# Patient Record
Sex: Male | Born: 1955 | Race: White | Hispanic: No | Marital: Married | Smoking: Current every day smoker
Health system: Southern US, Community
[De-identification: ages and names within clinical notes are randomized; demographics above are authoritative.]

## PROBLEM LIST (undated history)

## (undated) DIAGNOSIS — T7840XA Allergy, unspecified, initial encounter: Secondary | ICD-10-CM

## (undated) DIAGNOSIS — E785 Hyperlipidemia, unspecified: Secondary | ICD-10-CM

## (undated) DIAGNOSIS — F419 Anxiety disorder, unspecified: Secondary | ICD-10-CM

## (undated) DIAGNOSIS — I10 Essential (primary) hypertension: Secondary | ICD-10-CM

## (undated) HISTORY — DX: Allergy, unspecified, initial encounter: T78.40XA

## (undated) HISTORY — DX: Anxiety disorder, unspecified: F41.9

## (undated) HISTORY — PX: TONSILLECTOMY: SUR1361

## (undated) HISTORY — DX: Essential (primary) hypertension: I10

## (undated) HISTORY — DX: Hyperlipidemia, unspecified: E78.5

---

## 2021-05-23 ENCOUNTER — Encounter: Payer: Self-pay | Admitting: Medical

## 2021-05-23 ENCOUNTER — Other Ambulatory Visit (HOSPITAL_BASED_OUTPATIENT_CLINIC_OR_DEPARTMENT_OTHER): Payer: Self-pay

## 2021-05-23 ENCOUNTER — Ambulatory Visit (INDEPENDENT_AMBULATORY_CARE_PROVIDER_SITE_OTHER): Payer: 59 | Admitting: Medical

## 2021-05-23 VITALS — BP 130/72 | HR 74 | Temp 97.9°F | Resp 16 | Ht 69.0 in | Wt 154.4 lb

## 2021-05-23 DIAGNOSIS — Z122 Encounter for screening for malignant neoplasm of respiratory organs: Secondary | ICD-10-CM

## 2021-05-23 DIAGNOSIS — F419 Anxiety disorder, unspecified: Secondary | ICD-10-CM | POA: Diagnosis not present

## 2021-05-23 DIAGNOSIS — I10 Essential (primary) hypertension: Secondary | ICD-10-CM

## 2021-05-23 DIAGNOSIS — R7989 Other specified abnormal findings of blood chemistry: Secondary | ICD-10-CM

## 2021-05-23 DIAGNOSIS — Z125 Encounter for screening for malignant neoplasm of prostate: Secondary | ICD-10-CM | POA: Diagnosis not present

## 2021-05-23 DIAGNOSIS — J449 Chronic obstructive pulmonary disease, unspecified: Secondary | ICD-10-CM

## 2021-05-23 DIAGNOSIS — F17200 Nicotine dependence, unspecified, uncomplicated: Secondary | ICD-10-CM | POA: Diagnosis not present

## 2021-05-23 DIAGNOSIS — Z1283 Encounter for screening for malignant neoplasm of skin: Secondary | ICD-10-CM

## 2021-05-23 DIAGNOSIS — I252 Old myocardial infarction: Secondary | ICD-10-CM

## 2021-05-23 DIAGNOSIS — L989 Disorder of the skin and subcutaneous tissue, unspecified: Secondary | ICD-10-CM

## 2021-05-23 MED ORDER — BUSPIRONE HCL 10 MG PO TABS
10.0000 mg | ORAL_TABLET | Freq: Two times a day (BID) | ORAL | 0 refills | Status: DC
  Filled 2021-05-23: qty 60, 30d supply, fill #0

## 2021-05-23 MED ORDER — VENLAFAXINE HCL ER 37.5 MG PO CP24
37.5000 mg | ORAL_CAPSULE | Freq: Every day | ORAL | 0 refills | Status: DC
  Filled 2021-05-23: qty 30, 30d supply, fill #0

## 2021-05-23 MED ORDER — ATORVASTATIN CALCIUM 10 MG PO TABS
10.0000 mg | ORAL_TABLET | Freq: Every day | ORAL | 3 refills | Status: DC
  Filled 2021-05-23: qty 30, 30d supply, fill #0
  Filled 2021-06-20: qty 30, 30d supply, fill #1
  Filled 2021-07-24: qty 30, 30d supply, fill #2
  Filled 2021-08-20: qty 30, 30d supply, fill #3

## 2021-05-23 NOTE — Progress Notes (Signed)
Subjective:    Patient ID: Eric Gillespie, male    DOB: 25-Dec-1955, 66 y.o.   MRN: [id number]  HPI Pt in for first time.   Pt moved from [place]. Move to area 9 months ago.  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 No regular exercise apart from working on his house. Pt states avoiding salt and eating healthy. Smoker- pack a day since 66 yo. Approximate 46 yr pack a day. 5 shots of vodka a week.  Some mild ldl and triglyceride elevation.  Hypertension-on Tenormin 50 mg daily.  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 Recently former pcp told him he did not need to be on cholesterol meds any more?  Anxiety- pt is on buspar . He states on 10 mg twice a day and he feels like not working. No other meds for anxiety tried.  Phq-9 score was 14. He states feels more anxious.  Low testosterone in past. Most recent total T 132. Free 8.1. lab available done in [place].  Review of Systems  Constitutional:  Negative for chills, diaphoresis and fatigue.  HENT:  Negative for congestion.   Respiratory:  Negative for cough, chest tightness, shortness of breath and wheezing.   Cardiovascular:  Negative for chest pain and palpitations.  Gastrointestinal:  Negative for abdominal pain, blood in stool and nausea.  Musculoskeletal:  Negative for back pain, joint swelling, myalgias and neck stiffness.  Skin:  Negative for rash.  Psychiatric/Behavioral:  Positive for dysphoric mood. Negative for self-injury and suicidal ideas. The patient is nervous/anxious.     Past Medical History:  Diagnosis Date   Allergy    Anxiety    Hyperlipidemia    Hypertension      Social History   Socioeconomic History   Marital status: Married    Spouse name: Not on file   Number of children: Not on file   Years of education: Not on file   Highest education level: Not on file  Occupational History   Not on file  Tobacco Use   Smoking status: Every Day    Packs/day: 1.00    Types: Cigarettes   Smokeless tobacco: Never  Vaping Use    Vaping Use: Never used  Substance and Sexual Activity   Alcohol use: Yes   Drug use: Not Currently   Sexual activity: Yes  Other Topics Concern   Not on file  Social History Narrative   Not on file   Social Determinants of Health   Financial Resource Strain: Not on file  Food Insecurity: Not on file  Transportation Needs: Not on file  Physical Activity: Not on file  Stress: Not on file  Social Connections: Not on file  Intimate Partner Violence: Not on file      Family History  Problem Relation Age of Onset   Diabetes Mother    Arthritis Father    Diabetes Daughter     No Known Allergies  Current Outpatient Medications on File Prior to Visit  Medication Sig Dispense Refill   ASPIRIN 81 PO Take by mouth.     atenolol (TENORMIN) 50 MG tablet Take 50 mg by mouth daily.     busPIRone (BUSPAR) 10 MG tablet Take 10 mg by mouth 2 (two) times daily.     No current facility-administered medications on file prior to visit.    BP 130/72    Pulse 74    Temp 97.9 F (36.6 C)    Resp 16    Ht 5\' 9"  (1.753 m)  Wt 154 lb 6.4 oz (70 kg)    SpO2 95%    BMI 22.80 kg/m       Objective:   Physical Exam  General Mental Status- Alert. General Appearance- Not in acute distress.   Skin General: Color- Normal Color. Moisture- Normal Moisture.  Neck Carotid Arteries- Normal color. Moisture- Normal Moisture. No carotid bruits. No JVD.  Chest and Lung Exam Auscultation: Breath Sounds:-Normal.  Cardiovascular Auscultation:Rythm- Regular. Murmurs & Other Heart Sounds:Auscultation of the heart reveals- No Murmurs.  Abdomen Inspection:-Inspeection Normal. Palpation/Percussion:Note:No mass. Palpation and Percussion of the abdomen reveal- Non Tender, Non Distended + BS, no rebound or guarding.   Neurologic Cranial Nerve exam:- CN III-XII intact(No nystagmus), symmetric smile. Strength:- 5/5 equal and symmetric strength both upper and lower extremities.       Assessment  & Plan:   Patient Instructions  Hypertension-blood pressure well controlled.  Continue Tenormin 50 mg daily.  Smoke, history of MI years ago and mild hyperlipidemia.  Recommend smoking cessation.  Placed order for CT chest screening for lung cancer.  Decided to put you back on statin medication due to history and increased risk.  I sent in atorvastatin 10 mg daily prescription.  Low testosterone-I went ahead and placed referral to endocrinologist.  Make sure you take copies of your most recent testosterone levels to that appointment.  Placed order for PSA/screening prostate cancer.  Recent anxiety with GAD 7 score of 16 and depression screening score 14.  Prescribed low-dose Effexor 37.5 mg daily tablet and will have to continue BuSpar 10 mg twice daily.  On follow-up will determine if can discontinue BuSpar as you do not think that is really working presently.  Follow-up in 1 month or sooner if needed.   Esperanza Richters, PA-C

## 2021-05-23 NOTE — Addendum Note (Signed)
Addended by: Anabel Halon on: 05/23/2021 02:33 PM   Modules accepted: Orders

## 2021-05-23 NOTE — Patient Instructions (Addendum)
Hypertension-blood pressure well controlled.  Continue Tenormin 50 mg daily.  Smoke, history of MI years ago and mild hyperlipidemia.  Recommend smoking cessation.  Placed order for CT chest screening for lung cancer.  Decided to put you back on statin medication due to history and increased risk.  I sent in atorvastatin 10 mg daily prescription.  Low testosterone-I went ahead and placed referral to endocrinologist.  Make sure you take copies of your most recent testosterone levels to that appointment.  Placed order for PSA/screening prostate cancer.  Recent anxiety with GAD 7 score of 16 and depression screening score 14.  Prescribed low-dose Effexor 37.5 mg daily tablet and will have to continue BuSpar 10 mg twice daily.  On follow-up will determine if can discontinue BuSpar as you do not think that is really working presently.  Follow-up in 1 month or sooner if needed.

## 2021-05-24 LAB — PSA: PSA: 0.06 ng/mL — ABNORMAL LOW (ref 0.10–4.00)

## 2021-05-31 ENCOUNTER — Ambulatory Visit (INDEPENDENT_AMBULATORY_CARE_PROVIDER_SITE_OTHER): Payer: 59

## 2021-05-31 ENCOUNTER — Other Ambulatory Visit: Payer: Self-pay

## 2021-05-31 DIAGNOSIS — Z122 Encounter for screening for malignant neoplasm of respiratory organs: Secondary | ICD-10-CM

## 2021-05-31 DIAGNOSIS — F17210 Nicotine dependence, cigarettes, uncomplicated: Secondary | ICD-10-CM | POA: Diagnosis not present

## 2021-05-31 DIAGNOSIS — F17200 Nicotine dependence, unspecified, uncomplicated: Secondary | ICD-10-CM

## 2021-05-31 DIAGNOSIS — R69 Illness, unspecified: Secondary | ICD-10-CM | POA: Diagnosis not present

## 2021-06-03 NOTE — Addendum Note (Signed)
Addended by: Gwenevere Abbot on: 06/03/2021 10:26 AM   Modules accepted: Orders

## 2021-06-20 ENCOUNTER — Other Ambulatory Visit (HOSPITAL_BASED_OUTPATIENT_CLINIC_OR_DEPARTMENT_OTHER): Payer: Self-pay

## 2021-06-20 ENCOUNTER — Ambulatory Visit (INDEPENDENT_AMBULATORY_CARE_PROVIDER_SITE_OTHER): Payer: 59 | Admitting: Medical

## 2021-06-20 VITALS — BP 160/85 | HR 64 | Resp 18 | Ht 69.0 in | Wt 154.0 lb

## 2021-06-20 DIAGNOSIS — I252 Old myocardial infarction: Secondary | ICD-10-CM | POA: Diagnosis not present

## 2021-06-20 DIAGNOSIS — I10 Essential (primary) hypertension: Secondary | ICD-10-CM

## 2021-06-20 DIAGNOSIS — R69 Illness, unspecified: Secondary | ICD-10-CM | POA: Diagnosis not present

## 2021-06-20 DIAGNOSIS — F419 Anxiety disorder, unspecified: Secondary | ICD-10-CM | POA: Diagnosis not present

## 2021-06-20 DIAGNOSIS — F17200 Nicotine dependence, unspecified, uncomplicated: Secondary | ICD-10-CM | POA: Diagnosis not present

## 2021-06-20 DIAGNOSIS — F3289 Other specified depressive episodes: Secondary | ICD-10-CM

## 2021-06-20 MED ORDER — VENLAFAXINE HCL ER 37.5 MG PO CP24
37.5000 mg | ORAL_CAPSULE | Freq: Every day | ORAL | 3 refills | Status: DC
Start: 2021-06-20 — End: 2021-08-21
  Filled 2021-06-20: qty 30, 30d supply, fill #0
  Filled 2021-07-24: qty 30, 30d supply, fill #1
  Filled 2021-08-20: qty 30, 30d supply, fill #2

## 2021-06-20 MED ORDER — BUSPIRONE HCL 10 MG PO TABS
10.0000 mg | ORAL_TABLET | Freq: Two times a day (BID) | ORAL | 3 refills | Status: DC
  Filled 2021-06-20: qty 60, 30d supply, fill #0
  Filled 2021-07-24: qty 60, 30d supply, fill #1
  Filled 2021-08-20: qty 60, 30d supply, fill #2

## 2021-06-20 NOTE — Patient Instructions (Addendum)
Htn- bp high today but 4 cups of coffee, 4-5 cigarettes and stress driving over to office. Recheck bp at home. Please update me later today. Continue tenormin. If bp is still high over 140/90 would add diuretic. If any cardiac eor neurologic signs/symptoms occur be seen in ED. ? ? ? ?I had placed referral to dermatologist on last visit. Please call them below. ?[facility] ?: [address] [place], [place], [place] [address] ?[phone] ? ?For smoking recommend stop smoking or cut back significantly. ? ? ?Anxiety and depression much mproved. Continue effexor and bupsar. ? ?For CAD by CT and hx of mi continue atorvastatin. Recommended/offered referral to cardiologist. Declined but if you change your mind please let me know. ? ? ?Follow up in 3 months(provided bp improved on recheck later today) or sooner if needed. ? ? ?

## 2021-06-20 NOTE — Addendum Note (Signed)
Addended by: Anabel Halon on: 06/20/2021 10:52 AM ? ? Modules accepted: Orders ? ?

## 2021-06-20 NOTE — Progress Notes (Signed)
? ?Subjective:  ? ? Patient ID: Eric Gillespie, male    DOB: Jul 05, 1955, 66 y.o.   MRN: [id number] ? ?HPI ? ?Pt in for follow up. Last visit avs below. ? ?"Hypertension-blood pressure well controlled.  Continue Tenormin 50 mg daily. ?  ?Smoke, history of MI years ago and mild hyperlipidemia.  Recommend smoking cessation.  Placed order for CT chest screening for lung cancer.  Decided to put you back on statin medication due to history and increased risk.  I sent in atorvastatin 10 mg daily prescription. ?  ?Low testosterone-I went ahead and placed referral to endocrinologist.  Make sure you take copies of your most recent testosterone levels to that appointment. ?  ?Placed order for PSA/screening prostate cancer. ?  ?Recent anxiety with GAD 7 score of 16 and depression screening score 14.  Prescribed low-dose Effexor 37.5 mg daily tablet and will have to continue BuSpar 10 mg twice daily.  On follow-up will determine if can discontinue BuSpar as you do not think that is really working presently." ? ?On review pt does not get chest pain but did have MI 20 years ago.  ? ?Pt still smoking pack a day. ? ?Pt states anxiety and mood improved by 80-90 % per family. ? ?Pt bp is elevated today. 3 cups in past 2 hours and 4-5 cigarrets before came in. Also stressful drive over hear running late. Took bp med at 9:30. No cardiac or neurologic signs or symptoms. Pt states in past tried various classes of bp meds and had side effects to many. Only side effect he can remember is dizziness. Losartan caused dizziness.amlodipine also caused dizziness. Never used diuretic.  ? ? ?Review of Systems  ?Constitutional:  Negative for chills, diaphoresis and fatigue.  ?HENT:  Negative for congestion.   ?Respiratory:  Negative for cough, chest tightness, shortness of breath and wheezing.   ?Cardiovascular:  Negative for chest pain and palpitations.  ?Gastrointestinal:  Negative for abdominal pain, blood in stool and nausea.  ?Musculoskeletal:   Negative for back pain, joint swelling, myalgias and neck stiffness.  ?Skin:  Negative for rash.  ?Neurological:  Negative for dizziness, seizures, weakness, numbness and headaches.  ?Hematological:  Negative for adenopathy. Does not bruise/bleed easily.  ?Psychiatric/Behavioral:  Negative for behavioral problems, dysphoric mood, self-injury and suicidal ideas. The patient is nervous/anxious.   ?     Much improved.  ? ?Past Medical History:  ?Diagnosis Date  ? Allergy   ? Anxiety   ? Hyperlipidemia   ? Hypertension   ? ?  ?Social History  ? ?Socioeconomic History  ? Marital status: Married  ?  Spouse name: Not on file  ? Number of children: Not on file  ? Years of education: Not on file  ? Highest education level: Not on file  ?Occupational History  ? Not on file  ?Tobacco Use  ? Smoking status: Every Day  ?  Packs/day: 1.00  ?  Types: Cigarettes  ? Smokeless tobacco: Never  ?Vaping Use  ? Vaping Use: Never used  ?Substance and Sexual Activity  ? Alcohol use: Yes  ? Drug use: Not Currently  ? Sexual activity: Yes  ?Other Topics Concern  ? Not on file  ?Social History Narrative  ? Not on file  ? ?Social Determinants of Health  ? ?Financial Resource Strain: Not on file  ?Food Insecurity: Not on file  ?Transportation Needs: Not on file  ?Physical Activity: Not on file  ?Stress: Not on file  ?Social  Connections: Not on file  ?Intimate Partner Violence: Not on file  ? ? ? ? ?Family History  ?Problem Relation Age of Onset  ? Diabetes Mother   ? Arthritis Father   ? Diabetes Daughter   ? ? ?No Known Allergies ? ?Current Outpatient Medications on File Prior to Visit  ?Medication Sig Dispense Refill  ? ASPIRIN 81 PO Take by mouth.    ? atenolol (TENORMIN) 50 MG tablet Take 50 mg by mouth daily.    ? atorvastatin (LIPITOR) 10 MG tablet Take 1 tablet (10 mg total) by mouth daily. 90 tablet 3  ? busPIRone (BUSPAR) 10 MG tablet Take 10 mg by mouth 2 (two) times daily.    ? busPIRone (BUSPAR) 10 MG tablet Take 1 tablet (10 mg  total) by mouth 2 (two) times daily. 60 tablet 0  ? venlafaxine XR (EFFEXOR XR) 37.5 MG 24 hr capsule Take 1 capsule (37.5 mg total) by mouth daily with breakfast. 30 capsule 0  ? ?No current facility-administered medications on file prior to visit.  ? ? ?BP (!) 160/85   Pulse 64   Resp 18   Ht 5\' 9"  (1.753 m)   Wt 154 lb (69.9 kg)   SpO2 99%   BMI 22.74 kg/m?  ?  ?   ?Objective:  ? Physical Exam ? ?General ?Mental Status- Alert. General Appearance- Not in acute distress.  ? ?Skin ?General: Color- Normal Color. Moisture- Normal Moisture. ? ?Neck ?Carotid Arteries- Normal color. Moisture- Normal Moisture. No carotid bruits. No JVD. ? ?Chest and Lung Exam ?Auscultation: ?Breath Sounds:-Normal. ? ?Cardiovascular ?Auscultation:Rythm- Regular. ?Murmurs & Other Heart Sounds:Auscultation of the heart reveals- No Murmurs. ? ?Abdomen ?Inspection:-Inspeection Normal. ?Palpation/Percussion:Note:No mass. Palpation and Percussion of the abdomen reveal- Non Tender, Non Distended + BS, no rebound or guarding. ? ? ?Neurologic ?Cranial Nerve exam:- CN III-XII intact(No nystagmus), symmetric smile. ?Strength:- 5/5 equal and symmetric strength both upper and lower extremities.  ? ? ?   ?Assessment & Plan:  ? ?Patient Instructions  ?Htn- bp high today but 4 cups of coffee, 4-5 cigarettes and stress driving over to office. Recheck bp at home. Please update me later today. Continue tenormin. If bp is still high over 140/90 would add diuretic. If any cardiac eor neurologic signs/symptoms occur be seen in ED. ? ? ? ?I had placed referral to dermatologist on last visit. Please call them below. ?[facility] ?: [place], [place], [place] [address] ?[phone] ? ?For smoking recommend stop smoking or cut back significantly. ? ? ?Anxiety and depression much mproved. Continue effexor and bupsar. ? ?For CAD by CT and hx of mi continue atorvastatin. Recommended/offered referral to cardiologist. Declined  but if you change your mind please let me know. ? ? ?Follow up in 3 months(provided bp improved on recheck later today) or sooner if needed. ? ?  ? ?Mackie Pai, PA-C  ?

## 2021-07-24 ENCOUNTER — Telehealth: Payer: Self-pay | Admitting: Medical

## 2021-07-24 ENCOUNTER — Other Ambulatory Visit (HOSPITAL_BASED_OUTPATIENT_CLINIC_OR_DEPARTMENT_OTHER): Payer: Self-pay

## 2021-07-24 MED ORDER — ATENOLOL 50 MG PO TABS
50.0000 mg | ORAL_TABLET | Freq: Every day | ORAL | 11 refills | Status: DC
  Filled 2021-07-24: qty 30, 30d supply, fill #0

## 2021-07-24 NOTE — Telephone Encounter (Signed)
Can you send in , once prescribed by previous provider ?

## 2021-07-24 NOTE — Telephone Encounter (Signed)
Pt's wife called stating that the pt needed a refill on his atenolol. She also stated he has been taking it for a long time and that the pharmacy did not receive a fill order for it. After reviewing the pt's chart, found that no fill order was given from [facility] to fill this medication. Please Advise.  ? ?Medication:  ? ?atenolol (TENORMIN) 50 MG tablet [[id number]]  ? ?Has the patient contacted their pharmacy? Yes.   ?(If no, request that the patient contact the pharmacy for the refill.) ?(If yes, when and what did the pharmacy advise?)  ? ?They didn't have orders to fill. Contact PCP ? ?Preferred Pharmacy (with phone number or street name):  ? ?[facility]  ?[address], [place] [address]  ?Phone:  [phone]  Fax:  [phone]  ? ?Agent: Please be advised that RX refills may take up to 3 business days. We ask that you follow-up with your pharmacy.  ?

## 2021-07-24 NOTE — Addendum Note (Signed)
Addended by: Anabel Halon on: 07/24/2021 12:22 PM ? ? Modules accepted: Orders ? ?

## 2021-08-20 ENCOUNTER — Other Ambulatory Visit (HOSPITAL_BASED_OUTPATIENT_CLINIC_OR_DEPARTMENT_OTHER): Payer: Self-pay

## 2021-08-21 ENCOUNTER — Telehealth: Payer: Self-pay | Admitting: Medical

## 2021-08-21 MED ORDER — ATENOLOL 50 MG PO TABS: 50.0000 mg | ORAL_TABLET | Freq: Every day | ORAL | 2 refills | Status: DC

## 2021-08-21 MED ORDER — ATORVASTATIN CALCIUM 10 MG PO TABS: 10.0000 mg | ORAL_TABLET | Freq: Every day | ORAL | 3 refills | Status: DC

## 2021-08-21 MED ORDER — BUSPIRONE HCL 10 MG PO TABS: 10.0000 mg | ORAL_TABLET | Freq: Two times a day (BID) | ORAL | 0 refills | Status: DC

## 2021-08-21 MED ORDER — VENLAFAXINE HCL ER 37.5 MG PO CP24: 37.5000 mg | ORAL_CAPSULE | Freq: Every day | ORAL | 3 refills | Status: DC

## 2021-08-21 NOTE — Telephone Encounter (Signed)
90day supply sent.

## 2021-08-21 NOTE — Telephone Encounter (Signed)
Patient wife is requesting all meds to be refilled and if possible 90 day supply ? ?[facility] [phone] ?

## 2021-08-24 ENCOUNTER — Telehealth: Payer: Self-pay | Admitting: Medical

## 2021-08-24 NOTE — Telephone Encounter (Signed)
Medications sent on 5/16

## 2021-08-24 NOTE — Telephone Encounter (Signed)
Patient is needing ALL meds refilled Patient was meds sent to the [facility] that is on file

## 2021-09-18 ENCOUNTER — Encounter: Payer: Self-pay | Admitting: Family Medicine

## 2021-09-18 ENCOUNTER — Ambulatory Visit (INDEPENDENT_AMBULATORY_CARE_PROVIDER_SITE_OTHER): Payer: 59 | Admitting: Family Medicine

## 2021-09-18 ENCOUNTER — Other Ambulatory Visit (HOSPITAL_BASED_OUTPATIENT_CLINIC_OR_DEPARTMENT_OTHER): Payer: Self-pay

## 2021-09-18 VITALS — BP 180/102 | HR 78 | Temp 98.0°F | Resp 16 | Wt 160.0 lb

## 2021-09-18 DIAGNOSIS — F419 Anxiety disorder, unspecified: Secondary | ICD-10-CM | POA: Diagnosis not present

## 2021-09-18 DIAGNOSIS — I10 Essential (primary) hypertension: Secondary | ICD-10-CM | POA: Insufficient documentation

## 2021-09-18 DIAGNOSIS — L0291 Cutaneous abscess, unspecified: Secondary | ICD-10-CM | POA: Diagnosis not present

## 2021-09-18 MED ORDER — NEBIVOLOL HCL 10 MG PO TABS
10.0000 mg | ORAL_TABLET | Freq: Every day | ORAL | 3 refills | Status: DC
  Filled 2021-09-18: qty 30, 30d supply, fill #0
  Filled 2021-10-11: qty 30, 30d supply, fill #1

## 2021-09-18 MED ORDER — VENLAFAXINE HCL ER 75 MG PO CP24
75.0000 mg | ORAL_CAPSULE | Freq: Every day | ORAL | 1 refills | Status: DC
  Filled 2021-09-18: qty 30, 30d supply, fill #0

## 2021-09-18 MED ORDER — DOXYCYCLINE HYCLATE 100 MG PO TABS
100.0000 mg | ORAL_TABLET | Freq: Two times a day (BID) | ORAL | 0 refills | Status: DC
  Filled 2021-09-18: qty 20, 10d supply, fill #0

## 2021-09-18 NOTE — Assessment & Plan Note (Signed)
Poorly controlled will alter medications, encouraged DASH diet, minimize caffeine and obtain adequate sleep. Report concerning symptoms and follow up as directed and as needed 

## 2021-09-18 NOTE — Progress Notes (Signed)
Subjective:   By signing my name below, I, Shehryar Baig, attest that this documentation has been prepared under the direction and in the presence of Ann Held, DO  09/18/2021      Patient ID: Eric Gillespie, male    DOB: [birth year], 66 y.o.   MRN: [id number]  Chief Complaint  Patient presents with   Hypertension    Patient reports elevated BP readings at home. Highest one 165/92 on Monday.     Hypertension Pertinent negatives include no chest pain or shortness of breath.   Patient is in today for a office visit.   His blood pressure is elevated during this visit. He measured his blood pressure once in the past week and reports it was elevated. His blood pressure has been elevated since his last visit. He denies having any chest pain or shortness of breath. He is feeling anxiety and thinks that is increasing his blood pressure. He continues taking 50 mg atenolol daily PO and reports no new issues while taking it.  BP Readings from Last 3 Encounters:  09/18/21 (!) 180/102  06/20/21 (!) 160/85  05/23/21 130/72   Pulse Readings from Last 3 Encounters:  09/18/21 78  06/20/21 64  05/23/21 74   He continues taking 10 mg buspirone daily PO and 37.5 mg Effexor xr to manage his anxiety and depression and reports mild change in his symptoms. He finds no change in his symptoms while taking 10 mg buspirone.  His wife reports patient moves his legs frequently at night and talks in his sleep. She notes he started these symptoms when they were first married but they stopped until recently. He snores while sleeping and she notes he has occasionally stopped breathing while sleeping. He reports having cysts on his back and 2 on his hip. A cyst on his back popped and is painful. He is requesting to have them surgically removed. His wife reports he develops them frequently.    Past Medical History:  Diagnosis Date   Allergy    Anxiety    Hyperlipidemia    Hypertension     Past  Surgical History:  Procedure Laterality Date   TONSILLECTOMY      Family History  Problem Relation Age of Onset   Diabetes Mother    Arthritis Father    Diabetes Daughter     Social History   Socioeconomic History   Marital status: Married    Spouse name: Not on file   Number of children: Not on file   Years of education: Not on file   Highest education level: Not on file  Occupational History   Not on file  Tobacco Use   Smoking status: Every Day    Packs/day: 1.00    Types: Cigarettes   Smokeless tobacco: Never  Vaping Use   Vaping Use: Never used  Substance and Sexual Activity   Alcohol use: Yes   Drug use: Not Currently   Sexual activity: Yes  Other Topics Concern   Not on file  Social History Narrative   Not on file   Social Determinants of Health   Financial Resource Strain: Not on file  Food Insecurity: Not on file  Transportation Needs: Not on file  Physical Activity: Not on file  Stress: Not on file  Social Connections: Not on file  Intimate Partner Violence: Not on file    Outpatient Medications Prior to Visit  Medication Sig Dispense Refill   ASPIRIN 81 PO Take by mouth.  atorvastatin (LIPITOR) 10 MG tablet Take 1 tablet (10 mg total) by mouth daily. 90 tablet 3   atenolol (TENORMIN) 50 MG tablet Take 1 tablet (50 mg total) by mouth daily. 90 tablet 2   busPIRone (BUSPAR) 10 MG tablet Take 10 mg by mouth 2 (two) times daily.     busPIRone (BUSPAR) 10 MG tablet Take 1 tablet (10 mg total) by mouth 2 (two) times daily. 180 tablet 0   venlafaxine XR (EFFEXOR XR) 37.5 MG 24 hr capsule Take 1 capsule (37.5 mg total) by mouth daily with breakfast. 90 capsule 3   No facility-administered medications prior to visit.    No Known Allergies  Review of Systems  Respiratory:  Negative for shortness of breath.   Cardiovascular:  Negative for chest pain.  Skin:        (+)painful and red open cyst on back (+)dollar size cyst on back (+)2 cysts on hip   Psychiatric/Behavioral:  Positive for depression. The patient is nervous/anxious.        Objective:    Physical Exam Constitutional:      General: He is not in acute distress.    Appearance: Normal appearance. He is not ill-appearing.  HENT:     Head: Normocephalic and atraumatic.     Right Ear: External ear normal.     Left Ear: External ear normal.  Eyes:     Extraocular Movements: Extraocular movements intact.     Pupils: Pupils are equal, round, and reactive to light.  Cardiovascular:     Rate and Rhythm: Normal rate and regular rhythm.     Heart sounds: Normal heart sounds. No murmur heard.    No gallop.  Pulmonary:     Effort: Pulmonary effort is normal. No respiratory distress.     Breath sounds: Normal breath sounds. No wheezing or rales.  Skin:    General: Skin is warm and dry.     Comments: One silver dollar size cyst on back Quarter size cyst that had burst on back. Surrounding erythema and tender to touch.   Neurological:     Mental Status: He is alert and oriented to person, place, and time.  Psychiatric:        Judgment: Judgment normal.     BP (!) 180/102 (BP Location: Left Arm, Patient Position: Sitting, Cuff Size: Small)   Pulse 78   Temp 98 F (36.7 C) (Oral)   Resp 16   Wt 160 lb (72.6 kg)   SpO2 97%   BMI 23.63 kg/m  Wt Readings from Last 3 Encounters:  09/18/21 160 lb (72.6 kg)  06/20/21 154 lb (69.9 kg)  05/23/21 154 lb 6.4 oz (70 kg)    Diabetic Foot Exam - Simple   No data filed    Lab Results  Component Value Date   PSA 0.06 (L) 05/23/2021    No results found for: "TSH" No results found for: "WBC", "HGB", "HCT", "MCV", "PLT" No results found for: "NA", "K", "CHLORIDE", "CO2", "GLUCOSE", "BUN", "CREATININE", "BILITOT", "ALKPHOS", "AST", "ALT", "PROT", "ALBUMIN", "CALCIUM", "ANIONGAP", "EGFR", "GFR" No results found for: "CHOL" No results found for: "HDL" No results found for: "LDLCALC" No results found for: "TRIG" No results  found for: "CHOLHDL" No results found for: "HGBA1C"     Assessment & Plan:   Problem List Items Addressed This Visit       Unprioritized   Primary hypertension - Primary    Poorly controlled will alter medications, encouraged DASH diet, minimize caffeine and  obtain adequate sleep. Report concerning symptoms and follow up as directed and as needed      Relevant Medications   nebivolol (BYSTOLIC) 10 MG tablet   Other Relevant Orders   CBC with Differential/Platelet   Comprehensive metabolic panel   Lipid panel   Other Visit Diagnoses     Anxiety       Relevant Medications   venlafaxine XR (EFFEXOR XR) 75 MG 24 hr capsule   Abscess       Relevant Medications   doxycycline (VIBRA-TABS) 100 MG tablet   Other Relevant Orders   Ambulatory referral to General Surgery        Meds ordered this encounter  Medications   venlafaxine XR (EFFEXOR XR) 75 MG 24 hr capsule    Sig: Take 1 capsule (75 mg total) by mouth daily with breakfast.    Dispense:  90 capsule    Refill:  1   nebivolol (BYSTOLIC) 10 MG tablet    Sig: Take 1 tablet (10 mg total) by mouth daily.    Dispense:  90 tablet    Refill:  3   doxycycline (VIBRA-TABS) 100 MG tablet    Sig: Take 1 tablet (100 mg total) by mouth 2 (two) times daily.    Dispense:  20 tablet    Refill:  0    I, Ann Held, DO, personally preformed the services described in this documentation.  All medical record entries made by the scribe were at my direction and in my presence.  I have reviewed the chart and discharge instructions (if applicable) and agree that the record reflects my personal performance and is accurate and complete. 06/31/2023   I,Shehryar Baig,acting as a Education administrator for [employer], DO.,have documented all relevant documentation on the behalf of Ann Held, DO,as directed by  Ann Held, DO while in the presence of Ann Held, DO.   Ann Held, DO

## 2021-09-18 NOTE — Patient Instructions (Signed)

## 2021-09-19 LAB — CBC WITH DIFFERENTIAL/PLATELET
Basophils Absolute: 0.2 10*3/uL — ABNORMAL HIGH (ref 0.0–0.1)
Basophils Relative: 1.4 % (ref 0.0–3.0)
Eosinophils Absolute: 0.1 10*3/uL (ref 0.0–0.7)
Eosinophils Relative: 0.9 % (ref 0.0–5.0)
HCT: 42.4 % (ref 39.0–52.0)
Hemoglobin: 14.4 g/dL (ref 13.0–17.0)
Lymphocytes Relative: 17.9 % (ref 12.0–46.0)
Lymphs Abs: 2 10*3/uL (ref 0.7–4.0)
MCHC: 34 g/dL (ref 30.0–36.0)
MCV: 101.4 fl — ABNORMAL HIGH (ref 78.0–100.0)
Monocytes Absolute: 0.9 10*3/uL (ref 0.1–1.0)
Monocytes Relative: 7.9 % (ref 3.0–12.0)
Neutro Abs: 8.2 10*3/uL — ABNORMAL HIGH (ref 1.4–7.7)
Neutrophils Relative %: 71.9 % (ref 43.0–77.0)
Platelets: 252 10*3/uL (ref 150.0–400.0)
RBC: 4.18 Mil/uL — ABNORMAL LOW (ref 4.22–5.81)
RDW: 12.2 % (ref 11.5–15.5)
WBC: 11.4 10*3/uL — ABNORMAL HIGH (ref 4.0–10.5)

## 2021-09-19 LAB — LIPID PANEL
Cholesterol: 135 mg/dL (ref 0–200)
HDL: 52.4 mg/dL (ref >39.00)
NonHDL: 82.49
Total CHOL/HDL Ratio: 3
Triglycerides: 272 mg/dL — ABNORMAL HIGH (ref 0.0–149.0)
VLDL: 54.4 mg/dL — ABNORMAL HIGH (ref 0.0–40.0)

## 2021-09-19 LAB — COMPREHENSIVE METABOLIC PANEL
ALT: 17 U/L (ref 0–53)
AST: 36 U/L (ref 0–37)
Albumin: 4 g/dL (ref 3.5–5.2)
Alkaline Phosphatase: 99 U/L (ref 39–117)
BUN: 11 mg/dL (ref 6–23)
CO2: 24 mEq/L (ref 19–32)
Calcium: 9.5 mg/dL (ref 8.4–10.5)
Chloride: 100 mEq/L (ref 96–112)
Creatinine, Ser: 0.89 mg/dL (ref 0.40–1.50)
GFR: 91.22 mL/min (ref >60.00)
Glucose, Bld: 97 mg/dL (ref 70–99)
Potassium: 4 mEq/L (ref 3.5–5.1)
Sodium: 135 mEq/L (ref 135–145)
Total Bilirubin: 0.4 mg/dL (ref 0.2–1.2)
Total Protein: 7.2 g/dL (ref 6.0–8.3)

## 2021-09-19 LAB — LDL CHOLESTEROL, DIRECT: Direct LDL: 60 mg/dL

## 2021-09-24 ENCOUNTER — Telehealth: Payer: Self-pay

## 2021-09-24 MED ORDER — LOSARTAN POTASSIUM 50 MG PO TABS
50.0000 mg | ORAL_TABLET | Freq: Every day | ORAL | 3 refills | Status: DC
  Filled 2021-09-24: qty 30, 30d supply, fill #0

## 2021-09-24 NOTE — Addendum Note (Signed)
Addended by: Gwenevere Abbot on: 09/24/2021 07:07 PM   Modules accepted: Orders

## 2021-09-24 NOTE — Telephone Encounter (Signed)
Patient's wife called stating patient's blood pressure are still running high 160's and bottom number is in the low 90's , patient has a follow up appt on Tuesday , wants to know what to do until then   & also pt needs a new antibiotic because the one that was given on 09/18/21 made him sick

## 2021-09-25 ENCOUNTER — Other Ambulatory Visit (HOSPITAL_BASED_OUTPATIENT_CLINIC_OR_DEPARTMENT_OTHER): Payer: Self-pay

## 2021-09-25 NOTE — Telephone Encounter (Signed)
Spoke with patient he just started the medication.  Bp today 165/93, no cp, no HA, no SOB.  Advise him to give medication time to work and we will follow up with him on the 27th.  Go to ER immediately if he has any symptoms listed above.  Also he declined rocephin injection.  Info given for him to call [facility].

## 2021-09-25 NOTE — Telephone Encounter (Signed)
Contact Type Call Who Is Calling Patient / Member / Family / Caregiver Call Type Triage / Clinical Caller Name Jaime Grizzell Relationship To Patient Spouse Return Phone Number [phone] (Primary) Chief Complaint Blood Pressure High Reason for Call Symptomatic / Request for Health Information Initial Comment Caller states her husbands BP is 185/105, he was just had his meds adjusted. Lowest reading was 150/85. Translation No Nurse Assessment Nurse: Daphine Deutscher, RN, Cala Bradford Date/Time Lamount Cohen Time): 09/24/2021 5:37:35 PM Confirm and document reason for call. If symptomatic, describe symptoms. ---caller states her husband just had his blood pressure meds adjusted and has high BP right now. currently 185/105. no fever.  09/24/2021 5:52:52 PM See PCP within 24 Hours Yes Daphine Deutscher, Charity fundraiser, Cala Bradford

## 2021-10-02 ENCOUNTER — Other Ambulatory Visit (HOSPITAL_BASED_OUTPATIENT_CLINIC_OR_DEPARTMENT_OTHER): Payer: Self-pay

## 2021-10-02 ENCOUNTER — Ambulatory Visit (INDEPENDENT_AMBULATORY_CARE_PROVIDER_SITE_OTHER): Payer: 59 | Admitting: Medical

## 2021-10-02 ENCOUNTER — Encounter: Payer: Self-pay | Admitting: Medical

## 2021-10-02 VITALS — BP 155/85 | HR 70 | Temp 97.5°F | Ht 69.0 in | Wt 159.0 lb

## 2021-10-02 DIAGNOSIS — I10 Essential (primary) hypertension: Secondary | ICD-10-CM | POA: Diagnosis not present

## 2021-10-02 DIAGNOSIS — F419 Anxiety disorder, unspecified: Secondary | ICD-10-CM | POA: Diagnosis not present

## 2021-10-02 DIAGNOSIS — L723 Sebaceous cyst: Secondary | ICD-10-CM | POA: Diagnosis not present

## 2021-10-02 DIAGNOSIS — L089 Local infection of the skin and subcutaneous tissue, unspecified: Secondary | ICD-10-CM

## 2021-10-02 DIAGNOSIS — D72829 Elevated white blood cell count, unspecified: Secondary | ICD-10-CM | POA: Diagnosis not present

## 2021-10-02 DIAGNOSIS — F3289 Other specified depressive episodes: Secondary | ICD-10-CM

## 2021-10-02 DIAGNOSIS — R69 Illness, unspecified: Secondary | ICD-10-CM | POA: Diagnosis not present

## 2021-10-02 MED ORDER — LORAZEPAM 0.5 MG PO TABS
ORAL_TABLET | ORAL | 0 refills | Status: DC
  Filled 2021-10-02: qty 7, 7d supply, fill #0

## 2021-10-11 ENCOUNTER — Other Ambulatory Visit (HOSPITAL_BASED_OUTPATIENT_CLINIC_OR_DEPARTMENT_OTHER): Payer: Self-pay

## 2021-10-17 ENCOUNTER — Other Ambulatory Visit (HOSPITAL_BASED_OUTPATIENT_CLINIC_OR_DEPARTMENT_OTHER): Payer: Self-pay

## 2021-10-17 ENCOUNTER — Other Ambulatory Visit: Payer: Self-pay

## 2021-10-17 ENCOUNTER — Ambulatory Visit (INDEPENDENT_AMBULATORY_CARE_PROVIDER_SITE_OTHER): Payer: 59 | Admitting: Medical

## 2021-10-17 ENCOUNTER — Encounter: Payer: Self-pay | Admitting: Medical

## 2021-10-17 VITALS — BP 139/75 | HR 60 | Resp 18 | Ht 69.0 in | Wt 162.0 lb

## 2021-10-17 DIAGNOSIS — R079 Chest pain, unspecified: Secondary | ICD-10-CM | POA: Diagnosis not present

## 2021-10-17 DIAGNOSIS — R0683 Snoring: Secondary | ICD-10-CM

## 2021-10-17 DIAGNOSIS — I252 Old myocardial infarction: Secondary | ICD-10-CM | POA: Diagnosis not present

## 2021-10-17 DIAGNOSIS — I10 Essential (primary) hypertension: Secondary | ICD-10-CM

## 2021-10-17 DIAGNOSIS — R69 Illness, unspecified: Secondary | ICD-10-CM | POA: Diagnosis not present

## 2021-10-17 DIAGNOSIS — Z79899 Other long term (current) drug therapy: Secondary | ICD-10-CM

## 2021-10-17 DIAGNOSIS — F17200 Nicotine dependence, unspecified, uncomplicated: Secondary | ICD-10-CM | POA: Diagnosis not present

## 2021-10-17 DIAGNOSIS — F419 Anxiety disorder, unspecified: Secondary | ICD-10-CM

## 2021-10-17 DIAGNOSIS — E785 Hyperlipidemia, unspecified: Secondary | ICD-10-CM | POA: Diagnosis not present

## 2021-10-17 LAB — TROPONIN I (HIGH SENSITIVITY): High Sens Troponin I: 4 ng/L (ref 2–17)

## 2021-10-17 MED ORDER — NEBIVOLOL HCL 10 MG PO TABS: 10.0000 mg | ORAL_TABLET | Freq: Every day | ORAL | 3 refills | Status: DC

## 2021-10-17 MED ORDER — LOSARTAN POTASSIUM 50 MG PO TABS: 50.0000 mg | ORAL_TABLET | Freq: Every day | ORAL | 3 refills | Status: DC

## 2021-10-17 MED ORDER — ATORVASTATIN CALCIUM 10 MG PO TABS: 10.0000 mg | ORAL_TABLET | Freq: Every day | ORAL | 3 refills | Status: DC

## 2021-10-17 MED ORDER — SULFAMETHOXAZOLE-TRIMETHOPRIM 800-160 MG PO TABS
1.0000 | ORAL_TABLET | Freq: Two times a day (BID) | ORAL | 0 refills | Status: DC
  Filled 2021-10-17: qty 14, 7d supply, fill #0

## 2021-10-17 MED ORDER — VENLAFAXINE HCL ER 75 MG PO CP24: 75.0000 mg | ORAL_CAPSULE | Freq: Every day | ORAL | 1 refills | Status: DC

## 2021-10-17 MED ORDER — LORAZEPAM 0.5 MG PO TABS
0.5000 mg | ORAL_TABLET | Freq: Two times a day (BID) | ORAL | 3 refills | Status: DC | PRN
  Filled 2021-10-17: qty 30, 15d supply, fill #0
  Filled 2021-11-20: qty 30, 15d supply, fill #1
  Filled 2022-03-05: qty 30, 15d supply, fill #2

## 2021-10-17 MED ORDER — NEBIVOLOL HCL 10 MG PO TABS
10.0000 mg | ORAL_TABLET | Freq: Every day | ORAL | 0 refills | Status: DC
  Filled 2021-10-17: qty 14, 14d supply, fill #0

## 2021-10-17 NOTE — Addendum Note (Signed)
Addended by: Rosita Kea on: 10/17/2021 11:40 AM   Modules accepted: Orders

## 2021-10-17 NOTE — Addendum Note (Signed)
Addended by: Gwenevere Abbot on: 10/17/2021 11:34 AM   Modules accepted: Orders

## 2021-10-17 NOTE — Progress Notes (Addendum)
Subjective:    Patient ID: Eric Gillespie, male    DOB: [birth year], 66 y.o.   MRN: [id number]  HPI  Pt has htn- he has been checking bp at home. Pt is on bystoic 10 mg and losartan 50 mg daily. In morning before taking bp meds his bp was in 160-170 systolic range. He take bp med approx same time every day.   Pt has anxiety. He is on effexor 37.5 mg and ativan taking ativan 0.5 mg daily. He takes in the morning. He wakes up feeling anxious. He thinks ativan helps decrease his bp. He associates taking ativan with bp drops. Takes ativan and bp meds at same time. Later in day bp will be 120-130/ 70-80  Pt snores. Pt snored throughout his life. Wife states look like he stops breathing at night briefly.  Pt states Saturday he had chest pain for 3-4 hours. Chest pain stopped as he was just about to go to ED. He had heart attack 20 years ago. High cholesterol and pack a day for 52 years. Sweatiing at time of cheset pain. No jaw pai or shoulder pain.    Review of Systems  Constitutional:  Negative for chills, fatigue and fever.  Respiratory:  Negative for cough, choking, shortness of breath and wheezing.   Cardiovascular:  Negative for chest pain and palpitations.  Gastrointestinal:  Negative for abdominal pain.  Genitourinary:  Negative for dysuria, flank pain and frequency.  Musculoskeletal:  Negative for back pain.  Neurological:  Negative for dizziness, seizures, syncope, weakness and light-headedness.  Hematological:  Negative for adenopathy. Does not bruise/bleed easily.  Psychiatric/Behavioral:  The patient is nervous/anxious.        Controlled     Past Medical History:  Diagnosis Date   Allergy    Anxiety    Hyperlipidemia    Hypertension      Social History   Socioeconomic History   Marital status: Married    Spouse name: Not on file   Number of children: Not on file   Years of education: Not on file   Highest education level: Not on file  Occupational History   Not on  file  Tobacco Use   Smoking status: Every Day    Packs/day: 1.00    Types: Cigarettes   Smokeless tobacco: Never  Vaping Use   Vaping Use: Never used  Substance and Sexual Activity   Alcohol use: Yes   Drug use: Not Currently   Sexual activity: Yes  Other Topics Concern   Not on file  Social History Narrative   Not on file   Social Determinants of Health   Financial Resource Strain: Not on file  Food Insecurity: Not on file  Transportation Needs: Not on file  Physical Activity: Not on file  Stress: Not on file  Social Connections: Not on file  Intimate Partner Violence: Not on file    Past Surgical History:  Procedure Laterality Date   TONSILLECTOMY      Family History  Problem Relation Age of Onset   Diabetes Mother    Arthritis Father    Diabetes Daughter     No Known Allergies  Current Outpatient Medications on File Prior to Visit  Medication Sig Dispense Refill   ASPIRIN 81 PO Take by mouth.     LORazepam (ATIVAN) 0.5 MG tablet Take 1/2 - 1 tablet by mouth every morning as needed for anxiety. 7 tablet 0   No current facility-administered medications on file prior to  visit.    BP 139/75   Pulse 60   Resp 18   Ht 5\' 9"  (1.753 m)   Wt 162 lb (73.5 kg)   SpO2 96%   BMI 23.92 kg/m        Objective:   Physical Exam  General Mental Status- Alert. General Appearance- Not in acute distress.   Skin General: Color- Normal Color. Moisture- Normal Moisture.  Neck Carotid Arteries- Normal color. Moisture- Normal Moisture. No carotid bruits. No JVD.  Chest and Lung Exam Auscultation: Breath Sounds:-Normal.  Cardiovascular Auscultation:Rythm- Regular. Murmurs & Other Heart Sounds:Auscultation of the heart reveals- No Murmurs.  Abdomen Inspection:-Inspeection Normal. Palpation/Percussion:Note:No mass. Palpation and Percussion of the abdomen reveal- Non Tender, Non Distended + BS, no rebound or guarding.   Neurologic Cranial Nerve exam:- CN  III-XII intact(No nystagmus), symmetric smile. Strength:- 5/5 equal and symmetric strength both upper and lower extremities.        Assessment & Plan:   Patient Instructions  Hypertension-blood pressure is controlled today when I checked.  Middle of the day your blood pressures are also very good.  Would recommend to continue Bystolic and losartan.  If you continue to get BP readings above 140/90 when you first wake up then would recommend increasing losartan to 100 mg daily.  Anxiety-well-controlled with Effexor 37.5 mg and Ativan 0.5 mg daily.  Being very anxious might have impact on your blood pressure levels.  We will have the sign contract today and give urine drug screen.  Snoring-placed referral to pulmonologist for evaluation/likely sleep study.  Significant chest pain for 3 to 4 hours with sweating.  You report that it felt like your heart attack 20 years ago.  You did not go to the emergency department as you report the symptoms resolved just as you were about to go.  Will get 1 set stat troponin level now.  If your troponin is elevated then we will have to advise emergency department evaluation.  If you have recurrent chest pain have to advise emergency department evaluation as well.  We will go ahead and place referral to a cardiologist for evaluation.  Your EKG today showed mild sinus bradycardia.  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  I do not have your previous EKG to make comparison.  Follow-up in 2 weeks or sooner if needed.   [phone] pt [phone].  Time spent with patient today was  44 minutes which consisted of chart revdiew, discussing diagnosis, work up treatment and documentation.

## 2021-10-17 NOTE — Patient Instructions (Addendum)
Hypertension-blood pressure is controlled today when I checked.  Middle of the day your blood pressures are also very good.  Would recommend to continue Bystolic and losartan.  If you continue to get BP readings above 140/90 when you first wake up then would recommend increasing losartan to 100 mg daily.  Anxiety-well-controlled with Effexor 37.5 mg and Ativan 0.5 mg daily.  Being very anxious might have impact on your blood pressure levels.  We will have the sign contract today and give urine drug screen.  Snoring-placed referral to pulmonologist for evaluation/likely sleep study.  Significant chest pain for 3 to 4 hours with sweating.  You report that it felt like your heart attack 20 years ago.  You did not go to the emergency department as you report the symptoms resolved just as you were about to go.  Will get 1 set stat troponin level now.  If your troponin is elevated then we will have to advise emergency department evaluation.  If you have recurrent chest pain have to advise emergency department evaluation as well.  We will go ahead and place referral to a cardiologist for evaluation.  Your EKG today showed mild sinus bradycardia.  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  I do not have your previous EKG to make comparison.  Follow-up in 2 weeks or sooner if needed.  Rx bactrim for rt hip sebacious cyst looks probable infected

## 2021-10-20 LAB — DRUG TOX MONITOR 1 W/CONF, ORAL FLD
Alprazolam: NEGATIVE ng/mL (ref <0.50)
Amphetamines: NEGATIVE ng/mL (ref <10)
Barbiturates: NEGATIVE ng/mL (ref <10)
Benzodiazepines: POSITIVE ng/mL — AB (ref <0.50)
Buprenorphine: NEGATIVE ng/mL (ref <0.10)
Chlordiazepoxide: NEGATIVE ng/mL (ref <0.50)
Clonazepam: NEGATIVE ng/mL (ref <0.50)
Cocaine: NEGATIVE ng/mL (ref <5.0)
Cotinine: 176.8 ng/mL — ABNORMAL HIGH (ref <5.0)
Diazepam: NEGATIVE ng/mL (ref <0.50)
Fentanyl: NEGATIVE ng/mL (ref <0.10)
Flunitrazepam: NEGATIVE ng/mL (ref <0.50)
Flurazepam: NEGATIVE ng/mL (ref <0.50)
Heroin Metabolite: NEGATIVE ng/mL (ref <1.0)
Lorazepam: 1.33 ng/mL — ABNORMAL HIGH (ref <0.50)
MARIJUANA: NEGATIVE ng/mL (ref <2.5)
MDMA: NEGATIVE ng/mL (ref <10)
Meprobamate: NEGATIVE ng/mL (ref <2.5)
Methadone: NEGATIVE ng/mL (ref <5.0)
Midazolam: NEGATIVE ng/mL (ref <0.50)
Nicotine Metabolite: POSITIVE ng/mL — AB (ref <5.0)
Nordiazepam: NEGATIVE ng/mL (ref <0.50)
Opiates: NEGATIVE ng/mL (ref <2.5)
Oxazepam: NEGATIVE ng/mL (ref <0.50)
Phencyclidine: NEGATIVE ng/mL (ref <10)
Tapentadol: NEGATIVE ng/mL (ref <5.0)
Temazepam: NEGATIVE ng/mL (ref <0.50)
Tramadol: NEGATIVE ng/mL (ref <5.0)
Triazolam: NEGATIVE ng/mL (ref <0.50)
Zolpidem: NEGATIVE ng/mL (ref <5.0)

## 2021-10-25 ENCOUNTER — Telehealth: Payer: Self-pay

## 2021-10-25 ENCOUNTER — Other Ambulatory Visit (HOSPITAL_BASED_OUTPATIENT_CLINIC_OR_DEPARTMENT_OTHER): Payer: Self-pay

## 2021-10-25 MED ORDER — VENLAFAXINE HCL ER 37.5 MG PO CP24
37.5000 mg | ORAL_CAPSULE | Freq: Every day | ORAL | 3 refills | Status: DC
  Filled 2021-10-25 – 2021-11-07 (×4): qty 30, 30d supply, fill #0

## 2021-10-25 NOTE — Telephone Encounter (Signed)
Pt called and lvm to return call 

## 2021-10-25 NOTE — Telephone Encounter (Signed)
Patient requesting 37.5 effexor instead of 75 mg , sent to home pharmacy

## 2021-10-25 NOTE — Addendum Note (Signed)
Addended by: Gwenevere Abbot on: 10/25/2021 01:39 PM   Modules accepted: Orders

## 2021-10-26 NOTE — Progress Notes (Unsigned)
Cardiology Office Note:    Date:  10/29/2021   ID:  Eric Gillespie, DOB 1955/11/29, MRN [id number]  PCP:  Marisue Brooklyn   [facility] Providers Cardiologist:  None {  Referring MD: Esperanza Richters, PA-C    History of Present Illness:    Eric Gillespie is a 66 y.o. male with a hx of HTN, HLD, tobacco abuse, and anxiety who was referred by Thersa Salt, PA-C for further evaluation of chest pain.  Patient was seen by Esperanza Richters, PA-C where he had an episode of chest pain. Trop at that visit negative. He was then referred to Cardiology for further evaluation.  Today, the patient states that over the past 2 weeks, he has had 3 days with several episodes of chest pain. During the first episode, he was driving in the heat and he developed significant chest discomfort like a "horse" stepped on his chest. Symptoms resolved after about 5-34minutes, but recurred several more times that day. Since that time, he has had 2 more days where he has had several episodes of chest pain. Pain is not exertional but does tend to occur more in the head. Symptoms improve with laying down and resting. No associated SOB, lightheadedness, palpitations or diaphoresis. Has LE edema. No orthopnea, PND. 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  Patient also states that he has checked his blood pressure during the episodes of chest pain, his device notified him his pulse was irregular. He did not have palpitations at that time.   Current smoker about 1ppd; has been smoking 50 years. Not trying to quit currently but seemed open to the idea by the end of our conversation.  Mother passed away at age 9 from massive MI (smoker), brother arrhythmia and CAD with prior PCI  Past Medical History:  Diagnosis Date   Allergy    Anxiety    Hyperlipidemia    Hypertension     Past Surgical History:  Procedure Laterality Date   TONSILLECTOMY      Current Medications: Current Meds   Medication Sig   ASPIRIN 81 PO Take by mouth.   atorvastatin (LIPITOR) 20 MG tablet Take 1 tablet (20 mg total) by mouth daily.   LORazepam (ATIVAN) 0.5 MG tablet Take 1 tablet (0.5 mg total) by mouth 2 (two) times daily as needed for anxiety.   losartan (COZAAR) 50 MG tablet Take 1 tablet (50 mg total) by mouth daily.   nebivolol (BYSTOLIC) 10 MG tablet Take 1 tablet (10 mg total) by mouth daily for 14 days.   sulfamethoxazole-trimethoprim (BACTRIM DS) 800-160 MG tablet Take 1 tablet by mouth 2 (two) times daily.   venlafaxine XR (EFFEXOR XR) 37.5 MG 24 hr capsule Take 1 capsule (37.5 mg total) by mouth daily with breakfast.   [DISCONTINUED] atorvastatin (LIPITOR) 10 MG tablet Take 1 tablet (10 mg total) by mouth daily.     Allergies:   Patient has no known allergies.   Social History   Socioeconomic History   Marital status: Married    Spouse name: Not on file   Number of children: Not on file   Years of education: Not on file   Highest education level: Not on file  Occupational History   Not on file  Tobacco Use   Smoking status: Every Day    Packs/day: 1.00    Types: Cigarettes   Smokeless tobacco: Never  Vaping Use   Vaping Use: Never used  Substance and Sexual Activity  Alcohol use: Yes   Drug use: Not Currently   Sexual activity: Yes  Other Topics Concern   Not on file  Social History Narrative   Not on file   Social Determinants of Health   Financial Resource Strain: Not on file  Food Insecurity: Not on file  Transportation Needs: Not on file  Physical Activity: Not on file  Stress: Not on file  Social Connections: Not on file     Family History: The patient's family history includes Arthritis in his father; Diabetes in his daughter and mother.  ROS:   Please see the history of present illness.     All other systems reviewed and are negative.  EKGs/Labs/Other Studies Reviewed:    The following studies were reviewed today: CT chest  05/2021: FINDINGS: Cardiovascular: Heart size is normal. There is no significant pericardial fluid, thickening or pericardial calcification. There is aortic atherosclerosis, as well as atherosclerosis of the great vessels of the mediastinum and the coronary arteries, including calcified atherosclerotic plaque in the left main, left anterior descending and right coronary arteries.   Mediastinum/Nodes: No pathologically enlarged mediastinal or hilar lymph nodes. Please note that accurate exclusion of hilar adenopathy is limited on noncontrast CT scans. Esophagus is unremarkable in appearance. No axillary lymphadenopathy.   Lungs/Pleura: No suspicious appearing pulmonary nodules or masses are noted. No acute consolidative airspace disease. No pleural effusions. Diffuse bronchial wall thickening with mild centrilobular and paraseptal emphysema   Upper Abdomen: Aortic atherosclerosis.   Musculoskeletal: Bilateral gynecomastia. There are no aggressive appearing lytic or blastic lesions noted in the visualized portions of the skeleton.   IMPRESSION: 1. Lung-RADS 1S, negative. Continue annual screening with low-dose chest CT without contrast in 12 months. 2. The "S" modifier above refers to potentially clinically significant non lung cancer related findings. Specifically, there is aortic atherosclerosis, in addition to left main and 2 vessel coronary artery disease. Please note that although the presence of coronary artery calcium documents the presence of coronary artery disease, the severity of this disease and any potential stenosis cannot be assessed on this non-gated CT examination. Assessment for potential risk factor modification, dietary therapy or pharmacologic therapy may be warranted, if clinically indicated. 3. Mild diffuse bronchial wall thickening with mild centrilobular and paraseptal emphysema; imaging findings suggestive of underlying COPD.   Aortic Atherosclerosis  (ICD10-I70.0) and Emphysema (ICD10-J43.9).  EKG:  ECG 10/17/21 with sinus bradycardia. No acute ischemic changes  Recent Labs: 09/18/2021: ALT 17; BUN 11; Creatinine, Ser 0.89; Hemoglobin 14.4; Platelets 252.0; Potassium 4.0; Sodium 135  Recent Lipid Panel    Component Value Date/Time   CHOL 135 09/18/2021 1654   TRIG 272.0 (H) 09/18/2021 1654   HDL 52.40 09/18/2021 1654   CHOLHDL 3 09/18/2021 1654   VLDL 54.4 (H) 09/18/2021 1654   LDLDIRECT 60.0 09/18/2021 1654     Risk Assessment/Calculations:           Physical Exam:    VS:  BP 122/80   Pulse 72   Ht 5' 9" (1.753 m)   Wt 159 lb 9.6 oz (72.4 kg)   SpO2 95%   BMI 23.57 kg/m     Wt Readings from Last 3 Encounters:  10/29/21 159 lb 9.6 oz (72.4 kg)  10/17/21 162 lb (73.5 kg)  10/02/21 159 lb (72.1 kg)     GEN:  Well nourished, well developed in no acute distress HEENT: Normal NECK: No JVD; No carotid bruits CARDIAC: RRR, 1/6 systolic murmur RESPIRATORY:  Clear to auscultation without   rales, wheezing or rhonchi  ABDOMEN: Soft, non-tender, non-distended MUSCULOSKELETAL:  No edema; No deformity  SKIN: Trace ankle edema NEUROLOGIC:  Alert and oriented x 3 PSYCHIATRIC:  Normal affect   ASSESSMENT:    1. Precordial pain   2. Pre-procedure lab exam   3. Primary hypertension   4. Chest pain of uncertain etiology   5. Palpitations   6. Medication management   7. Hyperlipidemia, unspecified hyperlipidemia type   8. Tobacco abuse    PLAN:    In order of problems listed above:  #Chest Pain: #Coronary Ca on CT chest: Patient reports several episodes of substernal chest pressure over the past 2 weeks. Symptoms are not totally exertion related but have occurred with working outside in the heat. Resolve with laying down and resting. No associated SOB or diaphoresis. Given significant increase in nature of chest pain and risk factors including known multivessel coronary Ca, HTN, HLD, tobacco use and premature CAD in  family, will check cath and TTE for further evaluation. -Plan for [facility] for further work-up -Check TTE -Continue ASA 81mg  daily -Increase lipitor to 20mg  daily -Continue bystolic 10mg  daily -Continue losartan 50mg  daily  #Irregular HR: Noted on BP cuff when taking BP during chest pain episodes. Possible that his symptoms are related to Afib and will check monitor to work-up further. Patient denies any palpitations or SOB. -Check 7 day zio  #HTN: Well controlled.  -Continue losartan 50mg  daily -Continue bystolic 10mg  daily  #HLD: -Continue lipitor 20mg  daily -Repeat lipids in 6-8 weeks -Can adjust further pending cath  #Tobacco Abuse: -Tobacco cessation encouraged      Shared Decision Making/Informed Consent The risks [stroke (1 in 1000), death (1 in 1000), kidney failure [usually temporary] (1 in 500), bleeding (1 in 200), allergic reaction [possibly serious] (1 in 200)], benefits (diagnostic support and management of coronary artery disease) and alternatives of a cardiac catheterization were discussed in detail with Mr. Spadafore and he is willing to proceed.    Medication Adjustments/Labs and Tests Ordered: Current medicines are reviewed at length with the patient today.  Concerns regarding medicines are outlined above.  Orders Placed This Encounter  Procedures   Basic metabolic panel   CBC w/Diff   Lipid Profile   LONG TERM MONITOR (3-14 DAYS)   ECHOCARDIOGRAM COMPLETE   Meds ordered this encounter  Medications   atorvastatin (LIPITOR) 20 MG tablet    Sig: Take 1 tablet (20 mg total) by mouth daily.    Dispense:  90 tablet    Refill:  3    Dose increase    Patient Instructions  Medication Instructions:   INCREASE YOUR ATORVASTATIN (LIPITOR) TO 20 MG BY MOUTH DAILY  *If you need a refill on your cardiac medications before your next appointment, please call your pharmacy*   Lab Work:  1.) TODAY--BMET AND CBC W DIFF  2.)  IN 6-8 WEEKS HERE IN THE OFFICE--CHECK  LIPIDS--COME FASTING  If you have labs (blood work) drawn today and your tests are completely normal, you will receive your results only by: MyChart Message (if you have MyChart) OR A paper copy in the mail If you have any lab test that is abnormal or we need to change your treatment, we will call you to review the results.   Testing/Procedures:   [facility] CARDIOVASCULAR DIVISION [facility] OFFICE [address] [place], [address] [place] Dept: [phone] Loc: [phone]  MOHSEN ODENTHAL  10/29/2021  You are  scheduled for a Cardiac Catheterization on Thursday, July 27 with Dr. Verdis Prime.  1. Please arrive at the Main Entrance A at [facility]: [address] [place], [place] [address] at 8:30 AM (This time is two hours before your procedure to ensure your preparation). Free valet parking service is available.   Special note: Every effort is made to have your procedure done on time. Please understand that emergencies sometimes delay scheduled procedures.  2. Diet: Do not eat solid foods after midnight.  You may have clear liquids until 5 AM upon the day of the procedure.  3. Labs: You will need to have blood drawn on Monday, July 24 at [facility] at [facility]. [address], [place]  Open: 7:30am - 5pm    Phone: [phone]. You do not need to be fasting.  4. Medication instructions in preparation for your procedure:   Contrast Allergy: No  On the morning of your procedure, take Aspirin and any morning medicines NOT listed above.  You may use sips of water.  5. Plan to go home the same day, you will only stay overnight if medically necessary. 6. You MUST have a responsible adult to drive you home. 7. An adult MUST be with you the first 24 hours after you arrive home. 8. Bring a current list of your medications, and the last time and date medication taken. 9. Bring ID and current insurance  cards. 10.Please wear clothes that are easy to get on and off and wear slip-on shoes.  Thank you for allowing [place] to care for you!   -- [facility]      1.) Your physician has requested that you have an echocardiogram. Echocardiography is a painless test that uses sound waves to create images of your heart. It provides your doctor with information about the size and shape of your heart and how well your heart's chambers and valves are working. This procedure takes approximately one hour. There are no restrictions for this procedure.   2.) ZIO XT- Long Term Monitor Instructions  Your physician has requested you wear a ZIO patch monitor for 7  days.  This is a single patch monitor. [employer] supplies one patch monitor per enrollment. Additional stickers are not available. Please do not apply patch if you will be having a Nuclear Stress Test,  Echocardiogram, Cardiac CT, MRI, or Chest Xray during the period you would be wearing the  monitor. The patch cannot be worn during these tests. You cannot remove and re-apply the  ZIO XT patch monitor.  Your ZIO patch monitor will be mailed 3 day USPS to your address on file. It may take 3-5 days  to receive your monitor after you have been enrolled.  Once you have received your monitor, please review the enclosed instructions. Your monitor  has already been registered assigning a specific monitor serial # to you.  Billing and Patient Assistance Program Information  We have supplied [employer] with any of your insurance information on file for billing purposes. [employer] offers a sliding scale Patient Assistance Program for patients that do not have  insurance, or whose insurance does not completely cover the cost of the ZIO monitor.  You must apply for the Patient Assistance Program to qualify for this discounted rate.  To apply, please call [employer] at [phone], select option 4, select option 2, ask to apply for   Patient Assistance Program. Meredeth Ide will ask your household income, and how  many people  are in your household. They will quote your out-of-pocket cost based on that information.  [employer] will also be able to set up a 62-month, interest-free payment plan if needed.  Applying the monitor   Shave hair from upper left chest.  Hold abrader disc by orange tab. Rub abrader in 40 strokes over the upper left chest as  indicated in your monitor instructions.  Clean area with 4 enclosed alcohol pads. Let dry.  Apply patch as indicated in monitor instructions. Patch will be placed under collarbone on left  side of chest with arrow pointing upward.  Rub patch adhesive wings for 2 minutes. Remove white label marked "1". Remove the white  label marked "2". Rub patch adhesive wings for 2 additional minutes.  While looking in a mirror, press and release button in center of patch. A small green light will  flash 3-4 times. This will be your only indicator that the monitor has been turned on.  Do not shower for the first 24 hours. You may shower after the first 24 hours.  Press the button if you feel a symptom. You will hear a small click. Record Date, Time and  Symptom in the Patient Logbook.  When you are ready to remove the patch, follow instructions on the last 2 pages of Patient  Logbook. Stick patch monitor onto the last page of Patient Logbook.  Place Patient Logbook in the blue and white box. Use locking tab on box and tape box closed  securely. The blue and white box has prepaid postage on it. Please place it in the mailbox as  soon as possible. Your physician should have your test results approximately 7 days after the  monitor has been mailed back to [facility].  Call [facility] Customer Care at [phone] if you have questions regarding  your ZIO XT patch monitor. Call them immediately if you see an orange light blinking on your  monitor.  If your monitor falls off in less than 4  days, contact our Monitor department at [phone].  If your monitor becomes loose or falls off after 4 days call [employer] at [phone] for  suggestions on securing your monitor   Follow-Up:  ONE MONTH WITH AN EXTENDER IN THE [facility] FOLLOW-UP APPOINTMENT           Signed, Meriam Sprague, MD  10/29/2021 3:11 PM    [facility]

## 2021-10-26 NOTE — H&P (View-Only) (Signed)
Cardiology Office Note:    Date:  10/29/2021   ID:  Eric Gillespie, DOB 1955/11/29, MRN [id number]  PCP:  Marisue Brooklyn   [facility] Providers Cardiologist:  None {  Referring MD: Esperanza Richters, PA-C    History of Present Illness:    Eric Gillespie is a 66 y.o. male with a hx of HTN, HLD, tobacco abuse, and anxiety who was referred by Thersa Salt, PA-C for further evaluation of chest pain.  Patient was seen by Esperanza Richters, PA-C where he had an episode of chest pain. Trop at that visit negative. He was then referred to Cardiology for further evaluation.  Today, the patient states that over the past 2 weeks, he has had 3 days with several episodes of chest pain. During the first episode, he was driving in the heat and he developed significant chest discomfort like a "horse" stepped on his chest. Symptoms resolved after about 5-34minutes, but recurred several more times that day. Since that time, he has had 2 more days where he has had several episodes of chest pain. Pain is not exertional but does tend to occur more in the head. Symptoms improve with laying down and resting. No associated SOB, lightheadedness, palpitations or diaphoresis. Has LE edema. No orthopnea, PND. 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  Patient also states that he has checked his blood pressure during the episodes of chest pain, his device notified him his pulse was irregular. He did not have palpitations at that time.   Current smoker about 1ppd; has been smoking 50 years. Not trying to quit currently but seemed open to the idea by the end of our conversation.  Mother passed away at age 9 from massive MI (smoker), brother arrhythmia and CAD with prior PCI  Past Medical History:  Diagnosis Date   Allergy    Anxiety    Hyperlipidemia    Hypertension     Past Surgical History:  Procedure Laterality Date   TONSILLECTOMY      Current Medications: Current Meds   Medication Sig   ASPIRIN 81 PO Take by mouth.   atorvastatin (LIPITOR) 20 MG tablet Take 1 tablet (20 mg total) by mouth daily.   LORazepam (ATIVAN) 0.5 MG tablet Take 1 tablet (0.5 mg total) by mouth 2 (two) times daily as needed for anxiety.   losartan (COZAAR) 50 MG tablet Take 1 tablet (50 mg total) by mouth daily.   nebivolol (BYSTOLIC) 10 MG tablet Take 1 tablet (10 mg total) by mouth daily for 14 days.   sulfamethoxazole-trimethoprim (BACTRIM DS) 800-160 MG tablet Take 1 tablet by mouth 2 (two) times daily.   venlafaxine XR (EFFEXOR XR) 37.5 MG 24 hr capsule Take 1 capsule (37.5 mg total) by mouth daily with breakfast.   [DISCONTINUED] atorvastatin (LIPITOR) 10 MG tablet Take 1 tablet (10 mg total) by mouth daily.     Allergies:   Patient has no known allergies.   Social History   Socioeconomic History   Marital status: Married    Spouse name: Not on file   Number of children: Not on file   Years of education: Not on file   Highest education level: Not on file  Occupational History   Not on file  Tobacco Use   Smoking status: Every Day    Packs/day: 1.00    Types: Cigarettes   Smokeless tobacco: Never  Vaping Use   Vaping Use: Never used  Substance and Sexual Activity  Alcohol use: Yes   Drug use: Not Currently   Sexual activity: Yes  Other Topics Concern   Not on file  Social History Narrative   Not on file   Social Determinants of Health   Financial Resource Strain: Not on file  Food Insecurity: Not on file  Transportation Needs: Not on file  Physical Activity: Not on file  Stress: Not on file  Social Connections: Not on file     Family History: The patient's family history includes Arthritis in his father; Diabetes in his daughter and mother.  ROS:   Please see the history of present illness.     All other systems reviewed and are negative.  EKGs/Labs/Other Studies Reviewed:    The following studies were reviewed today: CT chest  05/2021: FINDINGS: Cardiovascular: Heart size is normal. There is no significant pericardial fluid, thickening or pericardial calcification. There is aortic atherosclerosis, as well as atherosclerosis of the great vessels of the mediastinum and the coronary arteries, including calcified atherosclerotic plaque in the left main, left anterior descending and right coronary arteries.   Mediastinum/Nodes: No pathologically enlarged mediastinal or hilar lymph nodes. Please note that accurate exclusion of hilar adenopathy is limited on noncontrast CT scans. Esophagus is unremarkable in appearance. No axillary lymphadenopathy.   Lungs/Pleura: No suspicious appearing pulmonary nodules or masses are noted. No acute consolidative airspace disease. No pleural effusions. Diffuse bronchial wall thickening with mild centrilobular and paraseptal emphysema   Upper Abdomen: Aortic atherosclerosis.   Musculoskeletal: Bilateral gynecomastia. There are no aggressive appearing lytic or blastic lesions noted in the visualized portions of the skeleton.   IMPRESSION: 1. Lung-RADS 1S, negative. Continue annual screening with low-dose chest CT without contrast in 12 months. 2. The "S" modifier above refers to potentially clinically significant non lung cancer related findings. Specifically, there is aortic atherosclerosis, in addition to left main and 2 vessel coronary artery disease. Please note that although the presence of coronary artery calcium documents the presence of coronary artery disease, the severity of this disease and any potential stenosis cannot be assessed on this non-gated CT examination. Assessment for potential risk factor modification, dietary therapy or pharmacologic therapy may be warranted, if clinically indicated. 3. Mild diffuse bronchial wall thickening with mild centrilobular and paraseptal emphysema; imaging findings suggestive of underlying COPD.   Aortic Atherosclerosis  (ICD10-I70.0) and Emphysema (ICD10-J43.9).  EKG:  ECG 10/17/21 with sinus bradycardia. No acute ischemic changes  Recent Labs: 09/18/2021: ALT 17; BUN 11; Creatinine, Ser 0.89; Hemoglobin 14.4; Platelets 252.0; Potassium 4.0; Sodium 135  Recent Lipid Panel    Component Value Date/Time   CHOL 135 09/18/2021 1654   TRIG 272.0 (H) 09/18/2021 1654   HDL 52.40 09/18/2021 1654   CHOLHDL 3 09/18/2021 1654   VLDL 54.4 (H) 09/18/2021 1654   LDLDIRECT 60.0 09/18/2021 1654     Risk Assessment/Calculations:           Physical Exam:    VS:  BP 122/80   Pulse 72   Ht 5\' 9"  (1.753 m)   Wt 159 lb 9.6 oz (72.4 kg)   SpO2 95%   BMI 23.57 kg/m     Wt Readings from Last 3 Encounters:  10/29/21 159 lb 9.6 oz (72.4 kg)  10/17/21 162 lb (73.5 kg)  10/02/21 159 lb (72.1 kg)     GEN:  Well nourished, well developed in no acute distress HEENT: Normal NECK: No JVD; No carotid bruits CARDIAC: RRR, 1/6 systolic murmur RESPIRATORY:  Clear to auscultation without  rales, wheezing or rhonchi  ABDOMEN: Soft, non-tender, non-distended MUSCULOSKELETAL:  No edema; No deformity  SKIN: Trace ankle edema NEUROLOGIC:  Alert and oriented x 3 PSYCHIATRIC:  Normal affect   ASSESSMENT:    1. Precordial pain   2. Pre-procedure lab exam   3. Primary hypertension   4. Chest pain of uncertain etiology   5. Palpitations   6. Medication management   7. Hyperlipidemia, unspecified hyperlipidemia type   8. Tobacco abuse    PLAN:    In order of problems listed above:  #Chest Pain: #Coronary Ca on CT chest: Patient reports several episodes of substernal chest pressure over the past 2 weeks. Symptoms are not totally exertion related but have occurred with working outside in the heat. Resolve with laying down and resting. No associated SOB or diaphoresis. Given significant increase in nature of chest pain and risk factors including known multivessel coronary Ca, HTN, HLD, tobacco use and premature CAD in  family, will check cath and TTE for further evaluation. -Plan for [facility] for further work-up -Check TTE -Continue ASA 81mg  daily -Increase lipitor to 20mg  daily -Continue bystolic 10mg  daily -Continue losartan 50mg  daily  #Irregular HR: Noted on BP cuff when taking BP during chest pain episodes. Possible that his symptoms are related to Afib and will check monitor to work-up further. Patient denies any palpitations or SOB. -Check 7 day zio  #HTN: Well controlled.  -Continue losartan 50mg  daily -Continue bystolic 10mg  daily  #HLD: -Continue lipitor 20mg  daily -Repeat lipids in 6-8 weeks -Can adjust further pending cath  #Tobacco Abuse: -Tobacco cessation encouraged      Shared Decision Making/Informed Consent The risks [stroke (1 in 1000), death (1 in 1000), kidney failure [usually temporary] (1 in 500), bleeding (1 in 200), allergic reaction [possibly serious] (1 in 200)], benefits (diagnostic support and management of coronary artery disease) and alternatives of a cardiac catheterization were discussed in detail with Eric Gillespie and he is willing to proceed.    Medication Adjustments/Labs and Tests Ordered: Current medicines are reviewed at length with the patient today.  Concerns regarding medicines are outlined above.  Orders Placed This Encounter  Procedures   Basic metabolic panel   CBC w/Diff   Lipid Profile   LONG TERM MONITOR (3-14 DAYS)   ECHOCARDIOGRAM COMPLETE   Meds ordered this encounter  Medications   atorvastatin (LIPITOR) 20 MG tablet    Sig: Take 1 tablet (20 mg total) by mouth daily.    Dispense:  90 tablet    Refill:  3    Dose increase    Patient Instructions  Medication Instructions:   INCREASE YOUR ATORVASTATIN (LIPITOR) TO 20 MG BY MOUTH DAILY  *If you need a refill on your cardiac medications before your next appointment, please call your pharmacy*   Lab Work:  1.) TODAY--BMET AND CBC W DIFF  2.)  IN 6-8 WEEKS HERE IN THE OFFICE--CHECK  LIPIDS--COME FASTING  If you have labs (blood work) drawn today and your tests are completely normal, you will receive your results only by: MyChart Message (if you have MyChart) OR A paper copy in the mail If you have any lab test that is abnormal or we need to change your treatment, we will call you to review the results.   Testing/Procedures:   [facility] CARDIOVASCULAR DIVISION [facility] OFFICE [address] [place], [address] [place] Dept: [phone] Loc: [phone]  Eric Gillespie  10/29/2021  You are  scheduled for a Cardiac Catheterization on Thursday, July 27 with Dr. Verdis Prime.  1. Please arrive at the Main Entrance A at [facility]: [address] [place], [place] [address] at 8:30 AM (This time is two hours before your procedure to ensure your preparation). Free valet parking service is available.   Special note: Every effort is made to have your procedure done on time. Please understand that emergencies sometimes delay scheduled procedures.  2. Diet: Do not eat solid foods after midnight.  You may have clear liquids until 5 AM upon the day of the procedure.  3. Labs: You will need to have blood drawn on Monday, July 24 at [facility] at [facility]. [address], [place]  Open: 7:30am - 5pm    Phone: [phone]. You do not need to be fasting.  4. Medication instructions in preparation for your procedure:   Contrast Allergy: No  On the morning of your procedure, take Aspirin and any morning medicines NOT listed above.  You may use sips of water.  5. Plan to go home the same day, you will only stay overnight if medically necessary. 6. You MUST have a responsible adult to drive you home. 7. An adult MUST be with you the first 24 hours after you arrive home. 8. Bring a current list of your medications, and the last time and date medication taken. 9. Bring ID and current insurance  cards. 10.Please wear clothes that are easy to get on and off and wear slip-on shoes.  Thank you for allowing [place] to care for you!   -- [facility]      1.) Your physician has requested that you have an echocardiogram. Echocardiography is a painless test that uses sound waves to create images of your heart. It provides your doctor with information about the size and shape of your heart and how well your heart's chambers and valves are working. This procedure takes approximately one hour. There are no restrictions for this procedure.   2.) ZIO XT- Long Term Monitor Instructions  Your physician has requested you wear a ZIO patch monitor for 7  days.  This is a single patch monitor. [employer] supplies one patch monitor per enrollment. Additional stickers are not available. Please do not apply patch if you will be having a Nuclear Stress Test,  Echocardiogram, Cardiac CT, MRI, or Chest Xray during the period you would be wearing the  monitor. The patch cannot be worn during these tests. You cannot remove and re-apply the  ZIO XT patch monitor.  Your ZIO patch monitor will be mailed 3 day USPS to your address on file. It may take 3-5 days  to receive your monitor after you have been enrolled.  Once you have received your monitor, please review the enclosed instructions. Your monitor  has already been registered assigning a specific monitor serial # to you.  Billing and Patient Assistance Program Information  We have supplied [employer] with any of your insurance information on file for billing purposes. [employer] offers a sliding scale Patient Assistance Program for patients that do not have  insurance, or whose insurance does not completely cover the cost of the ZIO monitor.  You must apply for the Patient Assistance Program to qualify for this discounted rate.  To apply, please call [employer] at [phone], select option 4, select option 2, ask to apply for   Patient Assistance Program. Meredeth Ide will ask your household income, and how  many people  are in your household. They will quote your out-of-pocket cost based on that information.  [employer] will also be able to set up a 62-month, interest-free payment plan if needed.  Applying the monitor   Shave hair from upper left chest.  Hold abrader disc by orange tab. Rub abrader in 40 strokes over the upper left chest as  indicated in your monitor instructions.  Clean area with 4 enclosed alcohol pads. Let dry.  Apply patch as indicated in monitor instructions. Patch will be placed under collarbone on left  side of chest with arrow pointing upward.  Rub patch adhesive wings for 2 minutes. Remove white label marked "1". Remove the white  label marked "2". Rub patch adhesive wings for 2 additional minutes.  While looking in a mirror, press and release button in center of patch. A small green light will  flash 3-4 times. This will be your only indicator that the monitor has been turned on.  Do not shower for the first 24 hours. You may shower after the first 24 hours.  Press the button if you feel a symptom. You will hear a small click. Record Date, Time and  Symptom in the Patient Logbook.  When you are ready to remove the patch, follow instructions on the last 2 pages of Patient  Logbook. Stick patch monitor onto the last page of Patient Logbook.  Place Patient Logbook in the blue and white box. Use locking tab on box and tape box closed  securely. The blue and white box has prepaid postage on it. Please place it in the mailbox as  soon as possible. Your physician should have your test results approximately 7 days after the  monitor has been mailed back to [facility].  Call [facility] Customer Care at [phone] if you have questions regarding  your ZIO XT patch monitor. Call them immediately if you see an orange light blinking on your  monitor.  If your monitor falls off in less than 4  days, contact our Monitor department at [phone].  If your monitor becomes loose or falls off after 4 days call [employer] at [phone] for  suggestions on securing your monitor   Follow-Up:  ONE MONTH WITH AN EXTENDER IN THE [facility] FOLLOW-UP APPOINTMENT           Signed, Meriam Sprague, MD  10/29/2021 3:11 PM    [facility]

## 2021-10-29 ENCOUNTER — Encounter: Payer: Self-pay | Admitting: Cardiology

## 2021-10-29 ENCOUNTER — Ambulatory Visit (INDEPENDENT_AMBULATORY_CARE_PROVIDER_SITE_OTHER): Payer: 59

## 2021-10-29 ENCOUNTER — Ambulatory Visit: Payer: 59 | Admitting: Cardiology

## 2021-10-29 ENCOUNTER — Other Ambulatory Visit (HOSPITAL_BASED_OUTPATIENT_CLINIC_OR_DEPARTMENT_OTHER): Payer: Self-pay

## 2021-10-29 VITALS — BP 122/80 | HR 72 | Ht 69.0 in | Wt 159.6 lb

## 2021-10-29 DIAGNOSIS — R072 Precordial pain: Secondary | ICD-10-CM | POA: Diagnosis not present

## 2021-10-29 DIAGNOSIS — Z720 Tobacco use: Secondary | ICD-10-CM

## 2021-10-29 DIAGNOSIS — R002 Palpitations: Secondary | ICD-10-CM

## 2021-10-29 DIAGNOSIS — I10 Essential (primary) hypertension: Secondary | ICD-10-CM

## 2021-10-29 DIAGNOSIS — Z79899 Other long term (current) drug therapy: Secondary | ICD-10-CM | POA: Diagnosis not present

## 2021-10-29 DIAGNOSIS — Z01812 Encounter for preprocedural laboratory examination: Secondary | ICD-10-CM | POA: Diagnosis not present

## 2021-10-29 DIAGNOSIS — R079 Chest pain, unspecified: Secondary | ICD-10-CM

## 2021-10-29 DIAGNOSIS — E785 Hyperlipidemia, unspecified: Secondary | ICD-10-CM | POA: Diagnosis not present

## 2021-10-29 MED ORDER — ATORVASTATIN CALCIUM 20 MG PO TABS
20.0000 mg | ORAL_TABLET | Freq: Every day | ORAL | 3 refills | Status: DC
  Filled 2021-10-29: qty 30, 30d supply, fill #0

## 2021-10-29 NOTE — Progress Notes (Unsigned)
Enrolled for [employer] to mail a ZIO XT long term holter monitor to the patients address on file.  

## 2021-10-29 NOTE — Patient Instructions (Signed)
Medication Instructions:   INCREASE YOUR ATORVASTATIN (LIPITOR) TO 20 MG BY MOUTH DAILY  *If you need a refill on your cardiac medications before your next appointment, please call your pharmacy*   Lab Work:  1.) TODAY--BMET AND CBC W DIFF  2.)  IN 6-8 WEEKS HERE IN THE OFFICE--CHECK LIPIDS--COME FASTING  If you have labs (blood work) drawn today and your tests are completely normal, you will receive your results only by: MyChart Message (if you have MyChart) OR A paper copy in the mail If you have any lab test that is abnormal or we need to change your treatment, we will call you to review the results.   Testing/Procedures:   [facility] CARDIOVASCULAR DIVISION [facility] OFFICE [address] [place] [address] Dept: [phone] Loc: [phone]  EZEKIAH MASSIE  10/29/2021  You are scheduled for a Cardiac Catheterization on Thursday, July 27 with Dr. Verdis Prime.  1. Please arrive at the Main Entrance A at [facility]: [address] [place], [place] [address] at 8:30 AM (This time is two hours before your procedure to ensure your preparation). Free valet parking service is available.   Special note: Every effort is made to have your procedure done on time. Please understand that emergencies sometimes delay scheduled procedures.  2. Diet: Do not eat solid foods after midnight.  You may have clear liquids until 5 AM upon the day of the procedure.  3. Labs: You will need to have blood drawn on Monday, July 24 at [facility] at [facility]. [address], [place]  Open: 7:30am - 5pm    Phone: [phone]. You do not need to be fasting.  4. Medication instructions in preparation for your procedure:   Contrast Allergy: No  On the morning of your procedure, take Aspirin and any morning medicines NOT listed above.  You may use sips of water.  5. Plan to go home the same day, you will only  stay overnight if medically necessary. 6. You MUST have a responsible adult to drive you home. 7. An adult MUST be with you the first 24 hours after you arrive home. 8. Bring a current list of your medications, and the last time and date medication taken. 9. Bring ID and current insurance cards. 10.Please wear clothes that are easy to get on and off and wear slip-on shoes.  Thank you for allowing [place] to care for you!   -- [place] [facility]      1.) Your physician has requested that you have an echocardiogram. Echocardiography is a painless test that uses sound waves to create images of your heart. It provides your doctor with information about the size and shape of your heart and how well your heart's chambers and valves are working. This procedure takes approximately one hour. There are no restrictions for this procedure.   2.) ZIO XT- Long Term Monitor Instructions  Your physician has requested you wear a ZIO patch monitor for 7  days.  This is a single patch monitor. [employer] supplies one patch monitor per enrollment. Additional stickers are not available. Please do not apply patch if you will be having a Nuclear Stress Test,  Echocardiogram, Cardiac CT, MRI, or Chest Xray during the period you would be wearing the  monitor. The patch cannot be worn during these tests. You cannot remove and re-apply the  ZIO XT patch monitor.  Your ZIO  patch monitor will be mailed 3 day USPS to your address on file. It may take 3-5 days  to receive your monitor after you have been enrolled.  Once you have received your monitor, please review the enclosed instructions. Your monitor  has already been registered assigning a specific monitor serial # to you.  Billing and Patient Assistance Program Information  We have supplied [employer] with any of your insurance information on file for billing purposes. [employer] offers a sliding scale Patient Assistance Program for patients that  do not have  insurance, or whose insurance does not completely cover the cost of the ZIO monitor.  You must apply for the Patient Assistance Program to qualify for this discounted rate.  To apply, please call [employer] at [phone], select option 4, select option 2, ask to apply for  Patient Assistance Program. Meredeth Ide will ask your household income, and how many people  are in your household. They will quote your out-of-pocket cost based on that information.  [employer] will also be able to set up a 51-month, interest-free payment plan if needed.  Applying the monitor   Shave hair from upper left chest.  Hold abrader disc by orange tab. Rub abrader in 40 strokes over the upper left chest as  indicated in your monitor instructions.  Clean area with 4 enclosed alcohol pads. Let dry.  Apply patch as indicated in monitor instructions. Patch will be placed under collarbone on left  side of chest with arrow pointing upward.  Rub patch adhesive wings for 2 minutes. Remove white label marked "1". Remove the white  label marked "2". Rub patch adhesive wings for 2 additional minutes.  While looking in a mirror, press and release button in center of patch. A small green light will  flash 3-4 times. This will be your only indicator that the monitor has been turned on.  Do not shower for the first 24 hours. You may shower after the first 24 hours.  Press the button if you feel a symptom. You will hear a small click. Record Date, Time and  Symptom in the Patient Logbook.  When you are ready to remove the patch, follow instructions on the last 2 pages of Patient  Logbook. Stick patch monitor onto the last page of Patient Logbook.  Place Patient Logbook in the blue and white box. Use locking tab on box and tape box closed  securely. The blue and white box has prepaid postage on it. Please place it in the mailbox as  soon as possible. Your physician should have your test results approximately 7 days after  the  monitor has been mailed back to [facility].  Call [facility] Customer Care at [phone] if you have questions regarding  your ZIO XT patch monitor. Call them immediately if you see an orange light blinking on your  monitor.  If your monitor falls off in less than 4 days, contact our Monitor department at [phone].  If your monitor becomes loose or falls off after 4 days call [employer] at [phone] for  suggestions on securing your monitor   Follow-Up:  ONE MONTH WITH AN EXTENDER IN THE OFFICE--POST-CATH FOLLOW-UP APPOINTMENT

## 2021-10-30 LAB — CBC WITH DIFFERENTIAL/PLATELET
Basophils Absolute: 0.1 10*3/uL (ref 0.0–0.2)
Basos: 1 %
EOS (ABSOLUTE): 0.2 10*3/uL (ref 0.0–0.4)
Eos: 2 %
Hematocrit: 39.4 % (ref 37.5–51.0)
Hemoglobin: 14.1 g/dL (ref 13.0–17.7)
Immature Grans (Abs): 0.1 10*3/uL (ref 0.0–0.1)
Immature Granulocytes: 1 %
Lymphocytes Absolute: 2.7 10*3/uL (ref 0.7–3.1)
Lymphs: 23 %
MCH: 34.8 pg — ABNORMAL HIGH (ref 26.6–33.0)
MCHC: 35.8 g/dL — ABNORMAL HIGH (ref 31.5–35.7)
MCV: 97 fL (ref 79–97)
Monocytes Absolute: 1 10*3/uL — ABNORMAL HIGH (ref 0.1–0.9)
Monocytes: 8 %
Neutrophils Absolute: 7.9 10*3/uL — ABNORMAL HIGH (ref 1.4–7.0)
Neutrophils: 65 %
Platelets: 275 10*3/uL (ref 150–450)
RBC: 4.05 x10E6/uL — ABNORMAL LOW (ref 4.14–5.80)
RDW: 11.8 % (ref 11.6–15.4)
WBC: 11.9 10*3/uL — ABNORMAL HIGH (ref 3.4–10.8)

## 2021-10-30 LAB — BASIC METABOLIC PANEL
BUN/Creatinine Ratio: 14 (ref 10–24)
BUN: 13 mg/dL (ref 8–27)
CO2: 19 mmol/L — ABNORMAL LOW (ref 20–29)
Calcium: 9.6 mg/dL (ref 8.6–10.2)
Chloride: 97 mmol/L (ref 96–106)
Creatinine, Ser: 0.94 mg/dL (ref 0.76–1.27)
Glucose: 115 mg/dL — ABNORMAL HIGH (ref 70–99)
Potassium: 4.6 mmol/L (ref 3.5–5.2)
Sodium: 133 mmol/L — ABNORMAL LOW (ref 134–144)
eGFR: 91 mL/min/{1.73_m2} (ref >59)

## 2021-10-31 ENCOUNTER — Ambulatory Visit: Payer: 59 | Admitting: Medical

## 2021-10-31 ENCOUNTER — Telehealth: Payer: Self-pay | Admitting: *Deleted

## 2021-10-31 NOTE — Telephone Encounter (Signed)
Reviewed procedure instructions with patient.  

## 2021-10-31 NOTE — Telephone Encounter (Signed)
Patient returned RN's call. 

## 2021-10-31 NOTE — Telephone Encounter (Signed)
This call was meant to be sent to Katina Dung, RN and not to the pre op call back pool.   I will forward to RN.

## 2021-10-31 NOTE — Telephone Encounter (Signed)
Cardiac Catheterization scheduled at [facility] for: Thursday November 01, 2021 10:30 AM Arrival time and place: [facility] Main Entrance A at: 8:30 AM   Nothing to eat after midnight prior to procedure, clear liquids until 5 AM day of procedure.  Medication instructions: -Usual morning medications can be taken with sips of water including aspirin 81 mg.  Confirmed patient has responsible adult to drive home post procedure and be with patient first 24 hours after arriving home.  Patient reports no new symptoms concerning for COVID-19 in the past 10 days.  Left message for patient to call back to review procedure instructions.

## 2021-11-01 ENCOUNTER — Ambulatory Visit (HOSPITAL_COMMUNITY)
Admission: RE | Admit: 2021-11-01 | Discharge: 2021-11-01 | Disposition: A | Discharge status: Home / Self Care | Payer: 59 | Attending: Cardiovascular Disease | Admitting: Cardiovascular Disease

## 2021-11-01 ENCOUNTER — Encounter (HOSPITAL_COMMUNITY)
Admission: RE | Disposition: A | Discharge status: Home / Self Care | Payer: Self-pay | Source: Home / Self Care | Attending: Cardiovascular Disease

## 2021-11-01 ENCOUNTER — Other Ambulatory Visit (HOSPITAL_COMMUNITY): Payer: Self-pay

## 2021-11-01 ENCOUNTER — Telehealth: Payer: Self-pay | Admitting: Pharmacy Technician

## 2021-11-01 ENCOUNTER — Telehealth (HOSPITAL_COMMUNITY): Payer: Self-pay | Admitting: Pharmacy Technician

## 2021-11-01 ENCOUNTER — Other Ambulatory Visit: Payer: Self-pay

## 2021-11-01 DIAGNOSIS — Z955 Presence of coronary angioplasty implant and graft: Secondary | ICD-10-CM | POA: Diagnosis not present

## 2021-11-01 DIAGNOSIS — Z8249 Family history of ischemic heart disease and other diseases of the circulatory system: Secondary | ICD-10-CM | POA: Diagnosis not present

## 2021-11-01 DIAGNOSIS — F419 Anxiety disorder, unspecified: Secondary | ICD-10-CM | POA: Diagnosis not present

## 2021-11-01 DIAGNOSIS — I25119 Atherosclerotic heart disease of native coronary artery with unspecified angina pectoris: Secondary | ICD-10-CM | POA: Diagnosis not present

## 2021-11-01 DIAGNOSIS — F17210 Nicotine dependence, cigarettes, uncomplicated: Secondary | ICD-10-CM | POA: Diagnosis not present

## 2021-11-01 DIAGNOSIS — E785 Hyperlipidemia, unspecified: Secondary | ICD-10-CM | POA: Diagnosis not present

## 2021-11-01 DIAGNOSIS — R69 Illness, unspecified: Secondary | ICD-10-CM | POA: Diagnosis not present

## 2021-11-01 DIAGNOSIS — I209 Angina pectoris, unspecified: Secondary | ICD-10-CM

## 2021-11-01 DIAGNOSIS — I10 Essential (primary) hypertension: Secondary | ICD-10-CM | POA: Insufficient documentation

## 2021-11-01 HISTORY — PX: CORONARY STENT INTERVENTION: CATH118234

## 2021-11-01 HISTORY — PX: LEFT HEART CATH AND CORONARY ANGIOGRAPHY: CATH118249

## 2021-11-01 SURGERY — LEFT HEART CATH AND CORONARY ANGIOGRAPHY
Anesthesia: LOCAL

## 2021-11-01 MED ORDER — SODIUM CHLORIDE 0.9% FLUSH: 3.0000 mL | INTRAVENOUS | Status: DC | PRN

## 2021-11-01 MED ORDER — ATORVASTATIN CALCIUM 80 MG PO TABS: 80.0000 mg | ORAL_TABLET | Freq: Every day | ORAL | Status: DC

## 2021-11-01 MED ORDER — SODIUM CHLORIDE 0.9% FLUSH: 3.0000 mL | Freq: Two times a day (BID) | INTRAVENOUS | Status: DC

## 2021-11-01 MED ORDER — ATORVASTATIN CALCIUM 80 MG PO TABS
80.0000 mg | ORAL_TABLET | Freq: Every day | ORAL | 1 refills | Status: DC
  Filled 2021-11-01: qty 30, 30d supply, fill #0
  Filled 2021-12-03: qty 30, 30d supply, fill #1

## 2021-11-01 MED ORDER — HEPARIN SODIUM (PORCINE) 1000 UNIT/ML IJ SOLN
INTRAMUSCULAR | Status: DC | PRN
  Administered 2021-11-01: 3800 [IU] via INTRAVENOUS
  Administered 2021-11-01: 1000 [IU] via INTRAVENOUS
  Administered 2021-11-01: 4000 [IU] via INTRAVENOUS

## 2021-11-01 MED ORDER — TICAGRELOR 90 MG PO TABS
90.0000 mg | ORAL_TABLET | Freq: Two times a day (BID) | ORAL | 2 refills | Status: DC
  Filled 2021-11-01: qty 60, 30d supply, fill #0
  Filled 2021-11-02: qty 60, 30d supply, fill #1

## 2021-11-01 MED ORDER — HEPARIN (PORCINE) IN NACL 1000-0.9 UT/500ML-% IV SOLN
INTRAVENOUS | Status: AC
  Filled 2021-11-01: qty 1000

## 2021-11-01 MED ORDER — SODIUM CHLORIDE 0.9 % WEIGHT BASED INFUSION
3.0000 mL/kg/h | INTRAVENOUS | Status: AC
  Administered 2021-11-01: 3 mL/kg/h via INTRAVENOUS

## 2021-11-01 MED ORDER — TICAGRELOR 90 MG PO TABS: 90.0000 mg | ORAL_TABLET | Freq: Two times a day (BID) | ORAL | Status: DC

## 2021-11-01 MED ORDER — MIDAZOLAM HCL 2 MG/2ML IJ SOLN
INTRAMUSCULAR | Status: DC | PRN
  Administered 2021-11-01: 1 mg via INTRAVENOUS
  Administered 2021-11-01: 2 mg via INTRAVENOUS

## 2021-11-01 MED ORDER — HEPARIN (PORCINE) IN NACL 1000-0.9 UT/500ML-% IV SOLN
INTRAVENOUS | Status: DC | PRN
  Administered 2021-11-01 (×2): 500 mL

## 2021-11-01 MED ORDER — LIDOCAINE HCL (PF) 1 % IJ SOLN
INTRAMUSCULAR | Status: AC
  Filled 2021-11-01: qty 30

## 2021-11-01 MED ORDER — FENTANYL CITRATE (PF) 100 MCG/2ML IJ SOLN
INTRAMUSCULAR | Status: DC | PRN
  Administered 2021-11-01: 50 ug via INTRAVENOUS
  Administered 2021-11-01: 25 ug via INTRAVENOUS

## 2021-11-01 MED ORDER — SODIUM CHLORIDE 0.9 % WEIGHT BASED INFUSION
1.0000 mL/kg/h | INTRAVENOUS | Status: DC
Start: 2021-11-01 — End: 2021-11-02

## 2021-11-01 MED ORDER — NITROGLYCERIN 0.4 MG SL SUBL
0.4000 mg | SUBLINGUAL_TABLET | SUBLINGUAL | 2 refills | Status: DC | PRN
  Filled 2021-11-01: qty 25, 7d supply, fill #0

## 2021-11-01 MED ORDER — VERAPAMIL HCL 2.5 MG/ML IV SOLN
INTRAVENOUS | Status: DC | PRN
  Administered 2021-11-01: 10 mL via INTRA_ARTERIAL

## 2021-11-01 MED ORDER — DIAZEPAM 5 MG PO TABS: 5.0000 mg | ORAL_TABLET | Freq: Four times a day (QID) | ORAL | Status: DC | PRN

## 2021-11-01 MED ORDER — ASPIRIN 81 MG PO CHEW: 81.0000 mg | CHEWABLE_TABLET | ORAL | Status: DC

## 2021-11-01 MED ORDER — MIDAZOLAM HCL 2 MG/2ML IJ SOLN
INTRAMUSCULAR | Status: AC
  Filled 2021-11-01: qty 2

## 2021-11-01 MED ORDER — SODIUM CHLORIDE 0.9% FLUSH
3.0000 mL | Freq: Two times a day (BID) | INTRAVENOUS | Status: DC
Start: 2021-11-01 — End: 2021-11-02

## 2021-11-01 MED ORDER — NITROGLYCERIN 1 MG/10 ML FOR IR/CATH LAB
INTRA_ARTERIAL | Status: DC | PRN
  Administered 2021-11-01 (×3): 200 ug via INTRACORONARY

## 2021-11-01 MED ORDER — ONDANSETRON HCL 4 MG/2ML IJ SOLN: 4.0000 mg | Freq: Four times a day (QID) | INTRAMUSCULAR | Status: DC | PRN

## 2021-11-01 MED ORDER — SODIUM CHLORIDE 0.9% FLUSH
3.0000 mL | INTRAVENOUS | Status: DC | PRN
Start: 2021-11-01 — End: 2021-11-02

## 2021-11-01 MED ORDER — TICAGRELOR 90 MG PO TABS
ORAL_TABLET | ORAL | Status: DC | PRN
  Administered 2021-11-01: 180 mg via ORAL

## 2021-11-01 MED ORDER — LABETALOL HCL 5 MG/ML IV SOLN: 10.0000 mg | INTRAVENOUS | Status: DC | PRN

## 2021-11-01 MED ORDER — SODIUM CHLORIDE 0.9 % IV SOLN: 250.0000 mL | INTRAVENOUS | Status: DC | PRN

## 2021-11-01 MED ORDER — NITROGLYCERIN 1 MG/10 ML FOR IR/CATH LAB
INTRA_ARTERIAL | Status: AC
  Filled 2021-11-01: qty 10

## 2021-11-01 MED ORDER — SODIUM CHLORIDE 0.9 % IV SOLN: INTRAVENOUS | Status: DC

## 2021-11-01 MED ORDER — HEPARIN SODIUM (PORCINE) 1000 UNIT/ML IJ SOLN
INTRAMUSCULAR | Status: AC
  Filled 2021-11-01: qty 10

## 2021-11-01 MED ORDER — HYDRALAZINE HCL 20 MG/ML IJ SOLN: 10.0000 mg | INTRAMUSCULAR | Status: DC | PRN

## 2021-11-01 MED ORDER — VERAPAMIL HCL 2.5 MG/ML IV SOLN
INTRAVENOUS | Status: AC
  Filled 2021-11-01: qty 2

## 2021-11-01 MED ORDER — IOHEXOL 350 MG/ML SOLN
INTRAVENOUS | Status: DC | PRN
  Administered 2021-11-01: 150 mL

## 2021-11-01 MED ORDER — ACETAMINOPHEN 325 MG PO TABS
650.0000 mg | ORAL_TABLET | ORAL | Status: DC | PRN
Start: 2021-11-01 — End: 2021-11-02

## 2021-11-01 MED ORDER — FENTANYL CITRATE (PF) 100 MCG/2ML IJ SOLN
INTRAMUSCULAR | Status: AC
  Filled 2021-11-01: qty 2

## 2021-11-01 MED ORDER — SODIUM CHLORIDE 0.9 % IV SOLN
250.0000 mL | INTRAVENOUS | Status: DC | PRN
Start: 2021-11-01 — End: 2021-11-02

## 2021-11-01 MED ORDER — ASPIRIN 81 MG PO CHEW: 81.0000 mg | CHEWABLE_TABLET | Freq: Every day | ORAL | Status: DC

## 2021-11-01 MED ORDER — LIDOCAINE HCL (PF) 1 % IJ SOLN
INTRAMUSCULAR | Status: DC | PRN
  Administered 2021-11-01: 2 mL

## 2021-11-01 SURGICAL SUPPLY — 22 items
BALL SAPPHIRE NC24 3.5X12 (BALLOONS) ×2
BALLN SAPPHIRE 2.0X12 (BALLOONS) ×2
BALLOON SAPPHIRE 2.0X12 (BALLOONS) IMPLANT
BALLOON SAPPHIRE NC24 3.5X12 (BALLOONS) IMPLANT
BAND CMPR LRG ZPHR (HEMOSTASIS) ×1
BAND ZEPHYR COMPRESS 30 LONG (HEMOSTASIS) ×1 IMPLANT
CATH OPTITORQUE TIG 4.0 5F (CATHETERS) ×1 IMPLANT
CATH VISTA GUIDE 6FR JR4 (CATHETERS) ×1 IMPLANT
GLIDESHEATH SLEND A-KIT 6F 22G (SHEATH) IMPLANT
GLIDESHEATH SLEND SS 6F .021 (SHEATH) ×1 IMPLANT
GUIDEWIRE INQWIRE 1.5J.035X260 (WIRE) IMPLANT
INQWIRE 1.5J .035X260CM (WIRE) ×2
KIT ENCORE 26 ADVANTAGE (KITS) ×1 IMPLANT
KIT HEART LEFT (KITS) ×2 IMPLANT
PACK CARDIAC CATHETERIZATION (CUSTOM PROCEDURE TRAY) ×2 IMPLANT
SHEATH PROBE COVER 6X72 (BAG) ×1 IMPLANT
STENT SYNERGY XD 3.0X20 (Permanent Stent) IMPLANT
SYNERGY XD 3.0X20 (Permanent Stent) ×2 IMPLANT
SYR MEDRAD MARK 7 150ML (SYRINGE) ×2 IMPLANT
TRANSDUCER W/STOPCOCK (MISCELLANEOUS) ×2 IMPLANT
TUBING CIL FLEX 10 FLL-RA (TUBING) ×2 IMPLANT
WIRE ASAHI PROWATER 180CM (WIRE) ×1 IMPLANT

## 2021-11-01 NOTE — Telephone Encounter (Signed)
Patient Advocate Encounter   Prior authorization for Brilinta 90MG  tablets is required/requested.  Per Test Claim: non-formulary   PA submitted on 11/01/21 to [employer] via CoverMyMeds Key [id number] Status is pending  Pharmacy Patient Advocate Fax:  [phone]

## 2021-11-01 NOTE — Discharge Instructions (Signed)

## 2021-11-01 NOTE — Telephone Encounter (Signed)
Pharmacy Patient Advocate Encounter  Insurance verification completed.    The patient is insured through [employer]   The patient is currently admitted and ran test claims for the following: Brilinta.  Copays and coinsurance results were relayed to Inpatient clinical team.

## 2021-11-01 NOTE — TOC Benefit Eligibility Note (Signed)
Patient Product/process development scientist completed.    The patient is currently admitted and upon discharge could be taking Brilinta 90 mg.  Product Not On Formulary  The patient is insured through [employer]     [name], CPhT Pharmacy Patient Advocate Specialist [facility] Patient Advocate Team Direct Number: [phone]  Fax: [phone]

## 2021-11-01 NOTE — Interval H&P Note (Signed)
Cath Lab Visit (complete for each Cath Lab visit)  Clinical Evaluation Leading to the Procedure:   ACS: No.  Non-ACS:    Anginal Classification: CCS II  Anti-ischemic medical therapy: Minimal Therapy (1 class of medications)  Non-Invasive Test Results: No non-invasive testing performed  Prior CABG: No previous CABG      History and Physical Interval Note:  11/01/2021 10:53 AM  Eric Gillespie  has presented today for surgery, with the diagnosis of chest pain.  The various methods of treatment have been discussed with the patient and family. After consideration of risks, benefits and other options for treatment, the patient has consented to  Procedure(s): LEFT HEART CATH AND CORONARY ANGIOGRAPHY (N/A) as a surgical intervention.  The patient's history has been reviewed, patient examined, no change in status, stable for surgery.  I have reviewed the patient's chart and labs.  Questions were answered to the patient's satisfaction.     Nicki Guadalajara

## 2021-11-01 NOTE — Discharge Summary (Addendum)
Discharge Summary for Same Day PCI   Patient ID: Eric Gillespie MRN: [id number]; DOB: [birth year]  Admit date: 11/01/2021 Discharge date: 11/01/2021  Primary Care Provider: Mackie Pai, PA-C  Primary Cardiologist: Freada Bergeron, MD  Primary Electrophysiologist:  None   Discharge Diagnoses    Principal Problem:   Angina pectoris [facility]) Active Problems:   Hyperlipidemia  Diagnostic Studies/Procedures    Cardiac Catheterization 11/01/2021:  Diagnostic Dominance: Right   Cardiac cath with demonstration of eccentric 75% mid RCA stenosis with wall motion abnormality PCI was performed.  He received an additional 4000 units of heparin and was given Brilinta 180 mg.  He received an additional 1000 units of heparin with ACT at 293.  A prowater wire was passed down the RCA.  The dilatation was done with a 2.0 x 12 mm balloon.  IC nitroglycerin was administered, and the vessel seem to get much larger following nitroglycerin administration.  A 3.0 x 20 mm Synergy stent was then inserted and successfully deployed at 13 and 14 atm.  Post stent dilatation was done with a 3.5 x 12 mm noncompliant balloon.  _____________   History of Present Illness     Eric Gillespie is a 66 y.o. male with with a hx of HTN, HLD, tobacco abuse, and anxiety who was referred to cardiology by Madie Reno, PA-C for further evaluation of chest pain.  The patient reported over the the past 2 weeks prior to visit, he had 3 days with several episodes of chest pain. During the first episode, he was driving in the heat and he developed significant chest discomfort like a "horse" stepped on his chest. Symptoms resolved after about 5-8mnutes, but recurred several more times that day. Since that time, he has had 2 more days where he has had several episodes of chest pain. Pain is not exertional but does tend to occur more in the head. Symptoms improve with laying down and resting. No associated SOB, lightheadedness,  palpitations or diaphoresis. Has LE edema. No orthopnea, PND. 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   Patient also stated that he had checked his blood pressure during the episodes of chest pain, his device notified him his pulse was irregular. He did not have palpitations at that time.    Current smoker about 1ppd; has been smoking 50 years. Not trying to quit currently but seemed open to the idea by the end of our conversation.   Mother passed away at age 4468from massive MI (smoker), brother arrhythmia and CAD with prior PCI  Cardiac catheterization was arranged for further evaluation.  Hospital Course     The patient underwent cardiac cath as noted above with eccentric 75% mid RCA stenosis treated with PCI/DES x1. Plan for DAPT with ASA/Brilinta for at least 6 months. The patient was seen by cardiac rehab while in short stay. There were no observed complications post cath. Radial cath site was re-evaluated prior to discharge and found to be stable without any complications. Instructions/precautions regarding cath site care were given prior to discharge.   TJERARDO COSTABILEwas seen by Dr. KClaiborne Billingsand determined stable for discharge home. Follow up with our office has been arranged. Medications are listed below. Pertinent changes include addition of Brilinta, increased atorvastatin to 836mdaily. Instructed to complete Ziopatch along with keeping scheduled echo appt.   _____________  Cath/PCI Registry Performance & Quality Measures: Aspirin prescribed? - Yes ADP Receptor Inhibitor (Plavix/Clopidogrel, Brilinta/Ticagrelor or Effient/Prasugrel) prescribed (includes medically managed patients)? -  Yes High Intensity Statin (Lipitor 40-106m or Crestor 20-421m prescribed? - Yes For EF <40%, was ACEI/ARB prescribed? - Not Applicable (EF >/= 4070%For EF <40%, Aldosterone Antagonist (Spironolactone or Eplerenone) prescribed? - Not Applicable (EF >/= 4096%Cardiac Rehab Phase II ordered  (Included Medically managed Patients)? - Yes _____________   Discharge Vitals Blood pressure 139/77, pulse 61, resp. rate 18, height _0  (1.753 m), weight 74.8 kg, SpO2 97 %.  Filed Weights   11/01/21 0919  Weight: 74.8 kg    Last Labs & Radiologic Studies    CBC No results for input(s): "WBC", "NEUTROABS", "HGB", "HCT", "MCV", "PLT" in the last 72 hours.  Basic Metabolic Panel No results for input(s): "NA", "K", "CL", "CO2", "GLUCOSE", "BUN", "CREATININE", "CALCIUM", "MG", "PHOS" in the last 72 hours.  Liver Function Tests No results for input(s): "AST", "ALT", "ALKPHOS", "BILITOT", "PROT", "ALBUMIN" in the last 72 hours. No results for input(s): "LIPASE", "AMYLASE" in the last 72 hours. High Sensitivity Troponin:   No results for input(s): "TROPONINIHS" in the last 720 hours.  BNP Invalid input(s): "POCBNP" D-Dimer No results for input(s): "DDIMER" in the last 72 hours. Hemoglobin A1C No results for input(s): "HGBA1C" in the last 72 hours. Fasting Lipid Panel No results for input(s): "CHOL", "HDL", "LDLCALC", "TRIG", "CHOLHDL", "LDLDIRECT" in the last 72 hours. Thyroid Function Tests No results for input(s): "TSH", "T4TOTAL", "T3FREE", "THYROIDAB" in the last 72 hours.  Invalid input(s): "FREET3" _____________  No results found.  Disposition   Pt is being discharged home today in good condition.  Follow-up Plans & Appointments     Follow-up Information     SwEmmaline LifeNP Follow up on 11/30/2021.   Specialty: Nurse Practitioner Why: at 9:15am for your follow up appt with Dr. PeHolland CommonsNP MiNatasha [place]: [phone]              Discharge Instructions     Amb Referral to Cardiac Rehabilitation   Complete by: As directed    [place]  pt not sure if he's interested   Diagnosis: Coronary Stents   After initial evaluation and assessments completed: Virtual Based Care may be  provided alone or in conjunction with Phase 2 Cardiac Rehab based on patient barriers.: Yes       Discharge Medications   Allergies as of 11/01/2021   No Known Allergies      Medication List     STOP taking these medications    sulfamethoxazole-trimethoprim 800-160 MG tablet Commonly known as: BACTRIM DS       TAKE these medications    aspirin EC 81 MG tablet Take 81 mg by mouth daily.   atorvastatin 80 MG tablet Commonly known as: LIPITOR Take 1 tablet (80 mg total) by mouth daily. What changed:  medication strength how much to take   Brilinta 90 MG Tabs tablet Generic drug: ticagrelor Take 1 tablet (90 mg total) by mouth 2 (two) times daily.   LORazepam 0.5 MG tablet Commonly known as: ATIVAN Take 1 tablet (0.5 mg total) by mouth 2 (two) times daily as needed for anxiety.   losartan 50 MG tablet Commonly known as: COZAAR Take 1 tablet (50 mg total) by mouth daily.   multivitamin with minerals Tabs tablet Take 1 tablet by mouth daily.   nebivolol 10 MG tablet Commonly known as: BYSTOLIC Take 1 tablet (10 mg total) by mouth daily for 14 days.   nitroGLYCERIN 0.4 MG  SL tablet Commonly known as: Nitrostat Place 1 tablet (0.4 mg total) under the tongue every 5 (five) minutes as needed.   venlafaxine XR 37.5 MG 24 hr capsule Commonly known as: Effexor XR Take 1 capsule (37.5 mg total) by mouth daily with breakfast.        Allergies No Known Allergies  Outstanding Labs/Studies   FLP/LFTs in 8 weeks   Duration of Discharge Encounter   Greater than 30 minutes including physician time.  Signed, Reino Bellis, NP 11/01/2021, 4:42 PM

## 2021-11-01 NOTE — Progress Notes (Signed)
CARDIAC REHAB PHASE I     Post cath/ stent placement education including risk factors, exercise guidelines, asa/ Brilinta importance, site care, restrictions, heart healthy diet, smoking cessation and CRP2. Pt not sure at this time if he would be interested in CRP2. Will make referral to [facility] per pt request. He and wife will discuss. No questions or concerns at this time.   [id number]  Woodroe Chen, RN BSN 11/01/2021 2:00 PM

## 2021-11-02 ENCOUNTER — Encounter (HOSPITAL_COMMUNITY): Payer: Self-pay | Admitting: Cardiovascular Disease

## 2021-11-02 ENCOUNTER — Telehealth: Payer: Self-pay | Admitting: Cardiology

## 2021-11-02 ENCOUNTER — Other Ambulatory Visit (HOSPITAL_COMMUNITY): Payer: Self-pay

## 2021-11-02 ENCOUNTER — Telehealth: Payer: Self-pay | Admitting: Physician Assistant

## 2021-11-02 LAB — POCT ACTIVATED CLOTTING TIME
Activated Clotting Time: 293 seconds
Activated Clotting Time: 395 seconds

## 2021-11-02 NOTE — Telephone Encounter (Signed)
Wife called stating they have received patient's heart monitor device but noted patient has an echo test scheduled and wanted to know if they should hold off on starting the monitor until after the echo test.  Please advise.

## 2021-11-02 NOTE — Telephone Encounter (Signed)
Reached out to Thea Alken to confirm that patient CAN wear zio monitor and it will not interact with the ECHO. Returned call to wife to inform her.

## 2021-11-02 NOTE — Telephone Encounter (Signed)
After patient was discharged, notification received that insurance has denied brilinta. Plavix and effient are on formulary.   Following first 30 days of brilinta, will likely need to switch to: 300 mg plavix x 1 dose --> 75 mg daily Or 60 mg effient x 1 dose --> 10 mg daily  May use P2Y12 transition order set.     Marcelino Duster, PA-C 11/02/2021, 8:38 AM [phone] [facility] [address] [place], [place] [address]

## 2021-11-02 NOTE — Telephone Encounter (Signed)
Patient Advocate Encounter  Received notification that the request for prior authorization for Brilinta 90MG  tablets  has been denied due to has not tried Plavix or Effient.       , CPhT Pharmacy Patient Advocate Specialist [facility] Patient Advocate Team Direct Number: [phone]  Fax: [phone]

## 2021-11-04 DIAGNOSIS — R002 Palpitations: Secondary | ICD-10-CM

## 2021-11-05 ENCOUNTER — Telehealth (HOSPITAL_COMMUNITY): Payer: Self-pay

## 2021-11-05 NOTE — Telephone Encounter (Signed)
Per phase I cardiac rehab, fax cardiac rehab to [facility] cardiac rehab.

## 2021-11-07 ENCOUNTER — Other Ambulatory Visit (HOSPITAL_BASED_OUTPATIENT_CLINIC_OR_DEPARTMENT_OTHER): Payer: Self-pay

## 2021-11-07 ENCOUNTER — Ambulatory Visit (INDEPENDENT_AMBULATORY_CARE_PROVIDER_SITE_OTHER): Payer: 59 | Admitting: Medical

## 2021-11-07 ENCOUNTER — Encounter: Payer: Self-pay | Admitting: Medical

## 2021-11-07 VITALS — BP 130/70 | HR 66 | Resp 18 | Ht 69.0 in | Wt 162.0 lb

## 2021-11-07 DIAGNOSIS — F419 Anxiety disorder, unspecified: Secondary | ICD-10-CM

## 2021-11-07 DIAGNOSIS — E785 Hyperlipidemia, unspecified: Secondary | ICD-10-CM | POA: Diagnosis not present

## 2021-11-07 DIAGNOSIS — I252 Old myocardial infarction: Secondary | ICD-10-CM

## 2021-11-07 DIAGNOSIS — F17200 Nicotine dependence, unspecified, uncomplicated: Secondary | ICD-10-CM

## 2021-11-07 DIAGNOSIS — I2510 Atherosclerotic heart disease of native coronary artery without angina pectoris: Secondary | ICD-10-CM

## 2021-11-07 DIAGNOSIS — I10 Essential (primary) hypertension: Secondary | ICD-10-CM

## 2021-11-07 DIAGNOSIS — R69 Illness, unspecified: Secondary | ICD-10-CM | POA: Diagnosis not present

## 2021-11-07 MED ORDER — NICOTINE 21 MG/24HR TD PT24
21.0000 mg | MEDICATED_PATCH | Freq: Every day | TRANSDERMAL | 0 refills | Status: DC
  Filled 2021-11-07: qty 28, 28d supply, fill #0

## 2021-11-07 NOTE — Addendum Note (Signed)
Addended by: Gwenevere Abbot on: 11/07/2021 10:48 AM   Modules accepted: Orders

## 2021-11-07 NOTE — Progress Notes (Signed)
Subjective:    Patient ID: Eric Gillespie, male    DOB: Aug 08, 1955, 66 y.o.   MRN: [id number]  HPI  Pt in for follow up.  On last visit with me A/P  "Hypertension-blood pressure is controlled today when I checked.  Middle of the day your blood pressures are also very good.  Would recommend to continue Bystolic and losartan.  If you continue to get BP readings above 140/90 when you first wake up then would recommend increasing losartan to 100 mg daily.   Anxiety-well-controlled with Effexor 37.5 mg and Ativan 0.5 mg daily.  Being very anxious might have impact on your blood pressure levels.  We will have the sign contract today and give urine drug screen.   Snoring-placed referral to pulmonologist for evaluation/likely sleep study.   Significant chest pain for 3 to 4 hours with sweating.  You report that it felt like your heart attack 20 years ago.  You did not go to the emergency department as you report the symptoms resolved just as you were about to go.  Will get 1 set stat troponin level now.  If your troponin is elevated then we will have to advise emergency department evaluation.  If you have recurrent chest pain have to advise emergency department evaluation as well.  We will go ahead and place referral to a cardiologist for evaluation.  Your EKG today showed mild sinus bradycardia.  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  I do not have your previous EKG to make comparison."   Cardiologist office visit  plan.  " #Chest Pain: #Coronary Ca on CT chest: Patient reports several episodes of substernal chest pressure over the past 2 weeks. Symptoms are not totally exertion related but have occurred with working outside in the heat. Resolve with laying down and resting. No associated SOB or diaphoresis. Given significant increase in nature of chest pain and risk factors including known multivessel coronary Ca, HTN, HLD, tobacco use and premature CAD in family, will check cath and TTE for further  evaluation. -Plan for [facility] for further work-up -Check TTE -Continue ASA 81mg  daily -Increase lipitor to 20mg  daily -Continue bystolic 10mg  daily -Continue losartan 50mg  daily   #Irregular HR: Noted on BP cuff when taking BP during chest pain episodes. Possible that his symptoms are related to Afib and will check monitor to work-up further. Patient denies any palpitations or SOB. -Check 7 day zio   #HTN: Well controlled.  -Continue losartan 50mg  daily -Continue bystolic 10mg  daily   #HLD: -Continue lipitor 20mg  daily -Repeat lipids in 6-8 weeks -Can adjust further pending cath   #Tobacco Abuse: -Tobacco cessation encouraged"   Pt on 11-01-21.  "The patient underwent cardiac cath as noted above with eccentric 75% mid RCA stenosis treated with PCI/DES x1. Plan for DAPT with ASA/Brilinta for at least 6 months. The patient was seen by cardiac rehab while in short stay. There were no observed complications post cath. Radial cath site was re-evaluated prior to discharge and found to be stable without any complications. Instructions/precautions regarding cath site care were given prior to discharge.    Eric Gillespie was seen by Dr. and determined stable for discharge home. Follow up with our office has been arranged. Medications are listed below. Pertinent changes include addition of Brilinta, increased atorvastatin to 80mg  daily. Instructed to complete Ziopatch along with keeping scheduled echo appt."   feels better since stent. No chest pain reported.   Bp has been better since stent.  He  is getting better bp readings since stent placed.  Anxiety controlled with effexor and ativan.  Review of Systems  Constitutional:  Negative for chills, fatigue and fever.  HENT:  Negative for congestion.   Respiratory:  Negative for cough, chest tightness, shortness of breath and wheezing.   Cardiovascular:  Negative for chest pain and palpitations.  Gastrointestinal:  Negative for  abdominal pain.  Musculoskeletal:  Negative for back pain.  Skin:  Negative for rash.  Neurological:  Negative for dizziness, speech difficulty, numbness and headaches.  Hematological:  Negative for adenopathy. Does not bruise/bleed easily.  Psychiatric/Behavioral:  Negative for confusion and decreased concentration.     Past Medical History:  Diagnosis Date   Allergy    Anxiety    Hyperlipidemia    Hypertension      Social History   Socioeconomic History   Marital status: Married    Spouse name: Not on file   Number of children: Not on file   Years of education: Not on file   Highest education level: Not on file  Occupational History   Not on file  Tobacco Use   Smoking status: Every Day    Packs/day: 1.00    Types: Cigarettes   Smokeless tobacco: Never  Vaping Use   Vaping Use: Never used  Substance and Sexual Activity   Alcohol use: Yes   Drug use: Not Currently   Sexual activity: Yes  Other Topics Concern   Not on file  Social History Narrative   Not on file   Social Determinants of Health   Financial Resource Strain: Not on file  Food Insecurity: Not on file  Transportation Needs: Not on file  Physical Activity: Not on file  Stress: Not on file  Social Connections: Not on file  Intimate Partner Violence: Not on file    Past Surgical History:  Procedure Laterality Date   CORONARY STENT INTERVENTION N/A 11/01/2021   Procedure: CORONARY STENT INTERVENTION;  Surgeon: Lennette Bihari, MD;  Location: [facility] INVASIVE CV LAB;  Service: Cardiovascular;  Laterality: N/A;   LEFT HEART CATH AND CORONARY ANGIOGRAPHY N/A 11/01/2021   Procedure: LEFT HEART CATH AND CORONARY ANGIOGRAPHY;  Surgeon: Lennette Bihari, MD;  Location: [facility] INVASIVE CV LAB;  Service: Cardiovascular;  Laterality: N/A;   TONSILLECTOMY      Family History  Problem Relation Age of Onset   Diabetes Mother    Arthritis Father    Diabetes Daughter     No Known Allergies  Current Outpatient  Medications on File Prior to Visit  Medication Sig Dispense Refill   aspirin EC 81 MG tablet Take 81 mg by mouth daily.     atorvastatin (LIPITOR) 80 MG tablet Take 1 tablet (80 mg total) by mouth daily. 90 tablet 1   LORazepam (ATIVAN) 0.5 MG tablet Take 1 tablet (0.5 mg total) by mouth 2 (two) times daily as needed for anxiety. 30 tablet 3   losartan (COZAAR) 50 MG tablet Take 1 tablet (50 mg total) by mouth daily. 90 tablet 3   Multiple Vitamin (MULTIVITAMIN WITH MINERALS) TABS tablet Take 1 tablet by mouth daily.     nitroGLYCERIN (NITROSTAT) 0.4 MG SL tablet Place 1 tablet (0.4 mg total) under the tongue every 5 (five) minutes as needed. 25 tablet 2   ticagrelor (BRILINTA) 90 MG TABS tablet Take 1 tablet (90 mg total) by mouth 2 (two) times daily. 180 tablet 2   venlafaxine XR (EFFEXOR XR) 37.5 MG 24 hr capsule Take 1  capsule (37.5 mg total) by mouth daily with breakfast. 90 capsule 3   nebivolol (BYSTOLIC) 10 MG tablet Take 1 tablet (10 mg total) by mouth daily for 14 days. 14 tablet 0   No current facility-administered medications on file prior to visit.    BP 130/70   Pulse 66   Resp 18   Ht 5\' 9"  (1.753 m)   Wt 162 lb (73.5 kg)   SpO2 98%   BMI 23.92 kg/m        Objective:   Physical Exam  General Mental Status- Alert. General Appearance- Not in acute distress.   Skin General: Color- Normal Color. Moisture- Normal Moisture.  Neck Carotid Arteries- Normal color. Moisture- Normal Moisture. No carotid bruits. No JVD.  Chest and Lung Exam Auscultation: Breath Sounds:-Normal.  Cardiovascular Auscultation:Rythm- Regular. Murmurs & Other Heart Sounds:Auscultation of the heart reveals- No Murmurs.  Abdomen Inspection:-Inspeection Normal. Palpation/Percussion:Note:No mass. Palpation and Percussion of the abdomen reveal- Non Tender, Non Distended + BS, no rebound or guarding.  Neurologic Cranial Nerve exam:- CN III-XII intact(No nystagmus), symmetric  smile. Strength:- 5/5 equal and symmetric strength both upper and lower extremities.       Assessment & Plan:   Htn-  bp well controlled today and recently. continue bystolic and losartan.  Anxiety-wellcontrolled with Effexor 37.5 mg and Ativan 0.5 mg daily.  After work up for chest pain thru cardiologist did get stent.   Continue brilinta but discuss next week with cardiologist issues with pharmacy/coverage of brilinta.  High cholesterol with hx of mi. Continue atorvastatin 80 mg daily.  Smoking cessation advised.    Follow up in one month or sooner if needed.  , PA-C

## 2021-11-07 NOTE — Patient Instructions (Addendum)
Htn-  bp well controlled today and recently. continue bystolic and losartan.  Anxiety-wellcontrolled with Effexor 37.5 mg and Ativan 0.5 mg daily.  After work up for chest pain thru cardiologist did get stent.   Continue brilinta but discuss next week with cardiologist issues with pharmacy/coverage of brilinta.  High cholesterol with hx of mi. Continue atorvastatin 80 mg daily.  Smoking cessation advised. After discussion ok'd for me to rx nicotine patch.   Follow up in one month or sooner if needed.

## 2021-11-12 ENCOUNTER — Other Ambulatory Visit (HOSPITAL_BASED_OUTPATIENT_CLINIC_OR_DEPARTMENT_OTHER): Payer: Self-pay

## 2021-11-12 DIAGNOSIS — L814 Other melanin hyperpigmentation: Secondary | ICD-10-CM | POA: Diagnosis not present

## 2021-11-12 DIAGNOSIS — L821 Other seborrheic keratosis: Secondary | ICD-10-CM | POA: Diagnosis not present

## 2021-11-12 DIAGNOSIS — D692 Other nonthrombocytopenic purpura: Secondary | ICD-10-CM | POA: Diagnosis not present

## 2021-11-12 DIAGNOSIS — L570 Actinic keratosis: Secondary | ICD-10-CM | POA: Diagnosis not present

## 2021-11-12 DIAGNOSIS — L720 Epidermal cyst: Secondary | ICD-10-CM | POA: Diagnosis not present

## 2021-11-12 DIAGNOSIS — X32XXXS Exposure to sunlight, sequela: Secondary | ICD-10-CM | POA: Diagnosis not present

## 2021-11-12 MED ORDER — FLUOROURACIL 5 % EX CREA
TOPICAL_CREAM | CUTANEOUS | 0 refills | Status: DC
  Filled 2021-11-12: qty 40, 30d supply, fill #0

## 2021-11-13 ENCOUNTER — Ambulatory Visit (HOSPITAL_COMMUNITY): Payer: 59 | Attending: Cardiology

## 2021-11-13 DIAGNOSIS — R079 Chest pain, unspecified: Secondary | ICD-10-CM | POA: Insufficient documentation

## 2021-11-13 DIAGNOSIS — R072 Precordial pain: Secondary | ICD-10-CM | POA: Insufficient documentation

## 2021-11-13 LAB — ECHOCARDIOGRAM COMPLETE
Area-P 1/2: 3.75 cm2
S' Lateral: 2.8 cm

## 2021-11-19 NOTE — Progress Notes (Unsigned)
Cardiology Office Note:    Date:  11/20/2021   ID:  Eric Gillespie, DOB December 15, 1955, MRN [id number]  PCP:  Marisue Brooklyn   [facility] Providers Cardiologist:  Meriam Sprague, MD     Referring MD: Esperanza Richters, [place]   Chief Complaint: Follow-up cardiac catheterization  History of Present Illness:    Eric Gillespie is a pleasant 66 y.o. male with a hx of CAD s/p DES x 1 to RCA,, HTN, HLD, tobacco abuse, family history of early CAD, and anxiety.  He was referred to cardiology by PCP for evaluation of chest pain and seen by Dr. Shari Prows on 10/29/2021.  He described 3 episodes of chest pain over the previous 2 weeks.  During first episode, he was driving in the heat and developed significant chest discomfort like a horse stepped on his chest.  Symptoms resolved after about 5 to 10 minutes but recurred several more times that day.  Since that time, 2 more days with several episodes of chest pain.  Pain is not exertional but does tend to occur more in the heat.  Symptoms improve with lying down and resting.  No associated SOB, lightheadedness, palpitations, or diaphoresis. Prior chest CT revealed triple vessel coronary disease. 50 PPD smoking history. Cardiac cath ordered due to concern for CAD. Cardiac monitor ordered to evaluate irregular HR noted during home BP monitoring.   Cardiac catheterization 11/01/2021 revealed significant CAD involving a large dominant RCA 30% smooth proximal stenosis and 75% eccentric stenosis in the RCA followed by 40% narrowing.  Mild nonobstructive CAD in the left coronary circulation with smooth 25% distal left main stenosis and 40% proximal circumflex stenosis.  Preserved global LV function with EF 55% but small area of mid inferior hypocontractility, LVEDP 9 mmHg. Successful PCI to [facility] with DES  with recommendation for DAPT x 6 months, preferably 12 mo.   Echo 11/13/21 showed normal LVEF 60 to 65%, no RWMA, normal diastolic parameters, global  longitudinal strain -18.6%, normal RV, no significant valvular dysfunction. Cardiac monitor is pending.  Today, he is here with his wife and reports a few episodes of chest pain since cath on 7/27.  Reports no discomfort x 1 week, however since that time has had 3 episodes of significant discomfort. The first occurred early one morning, had gotten up and was sitting at computer drinking coffee. Felt weak in his arms and legs and chest started hurting.  Pain lasted 30 minutes, went away on its own, then returned and lasted for 10 minutes again going away on its own. BP was normal at that time. He got sweaty and his legs were shaking. His wife noticed he was belching a lot at that time. Additional episode occurred after using heavy equipment outside. He went in to rest and had pain while sitting in chair. Felt the same sort of discomfort the following day.  Did not think to take SL NTG. Feels better when lying down. Has mild SOB with walking that is chronic, attributes it to smoking. Trying to avoid salt. Continues to smoke. Completed monitor and mailed it in. States the discomfort described to Dr. Shari Prows was more like a "cat stepping on his chest." A very mild chest discomfort associated with an irregular HR notation on BP machine. 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  Feels that cardiac rehab is too time-consuming. He denies palpitations, melena, hematuria, hemoptysis, diaphoresis, weakness, presyncope, syncope, orthopnea, and PND. Has mild bilateral LE edema that improves overnight.  Past Medical History:  Diagnosis Date   Allergy    Anxiety    Hyperlipidemia    Hypertension     Past Surgical History:  Procedure Laterality Date   CORONARY STENT INTERVENTION N/A 11/01/2021   Procedure: CORONARY STENT INTERVENTION;  Surgeon: Lennette Bihari, MD;  Location: [facility] INVASIVE CV LAB;  Service: Cardiovascular;  Laterality: N/A;   LEFT HEART CATH AND CORONARY ANGIOGRAPHY N/A 11/01/2021    Procedure: LEFT HEART CATH AND CORONARY ANGIOGRAPHY;  Surgeon: Lennette Bihari, MD;  Location: [facility] INVASIVE CV LAB;  Service: Cardiovascular;  Laterality: N/A;   TONSILLECTOMY      Current Medications: Current Meds  Medication Sig   aspirin EC 81 MG tablet Take 81 mg by mouth daily.   atorvastatin (LIPITOR) 80 MG tablet Take 1 tablet (80 mg total) by mouth daily.   clopidogrel (PLAVIX) 75 MG tablet Take 4 tablets (300 mg total) by mouth once for 1 dose. 12 hours after last ticagrelor dose   clopidogrel (PLAVIX) 75 MG tablet Take 1 tablet (75 mg total) by mouth daily.   fluorouracil (EFUDEX) 5 % cream Apply 1(one) application topically 2(two) times a day for 2-4 weeks   LORazepam (ATIVAN) 0.5 MG tablet Take 1 tablet (0.5 mg total) by mouth 2 (two) times daily as needed for anxiety.   losartan (COZAAR) 50 MG tablet Take 1 tablet (50 mg total) by mouth daily.   Multiple Vitamin (MULTIVITAMIN WITH MINERALS) TABS tablet Take 1 tablet by mouth daily.   nebivolol (BYSTOLIC) 10 MG tablet Take 1 tablet (10 mg total) by mouth daily for 14 days.   nicotine (NICODERM CQ - DOSED IN MG/24 HOURS) 21 mg/24hr patch Place 1 patch (21 mg total) onto the skin daily.   nitroGLYCERIN (NITROSTAT) 0.4 MG SL tablet Place 1 tablet (0.4 mg total) under the tongue every 5 (five) minutes as needed.   venlafaxine XR (EFFEXOR XR) 37.5 MG 24 hr capsule Take 1 capsule (37.5 mg total) by mouth daily with breakfast.   [DISCONTINUED] ticagrelor (BRILINTA) 90 MG TABS tablet Take 1 tablet (90 mg total) by mouth 2 (two) times daily.     Allergies:   Patient has no known allergies.   Social History   Socioeconomic History   Marital status: Married    Spouse name: Not on file   Number of children: Not on file   Years of education: Not on file   Highest education level: Not on file  Occupational History   Not on file  Tobacco Use   Smoking status: Every Day    Packs/day: 1.00    Types: Cigarettes   Smokeless tobacco:  Never  Vaping Use   Vaping Use: Never used  Substance and Sexual Activity   Alcohol use: Yes   Drug use: Not Currently   Sexual activity: Yes  Other Topics Concern   Not on file  Social History Narrative   Not on file   Social Determinants of Health   Financial Resource Strain: Not on file  Food Insecurity: Not on file  Transportation Needs: Not on file  Physical Activity: Not on file  Stress: Not on file  Social Connections: Not on file     Family History: The patient's family history includes Arthritis in his father; Diabetes in his daughter and mother.  ROS:   Please see the history of present illness.    + chest pain All other systems reviewed and are negative.  Labs/Other Studies Reviewed:    The  following studies were reviewed today:    Prox RCA lesion is 30% stenosed.   Mid RCA-1 lesion is 75% stenosed.   Mid RCA-2 lesion is 40% stenosed.   Prox Cx lesion is 40% stenosed.   Mid LM to Dist LM lesion is 25% stenosed.   A drug-eluting stent was successfully placed.   Post intervention, there is a 0% residual stenosis.   Post intervention, there is a 0% residual stenosis.   The left ventricular systolic function is normal.   LV end diastolic pressure is normal.   Significant CAD involving a large dominant RCA with 30% smooth proximal stenosis, and 75% eccentric stenosis in the RCA followed by 40% narrowing.   Mild nonobstructive CAD in the left coronary circulation with smooth 25% distal left main stenosis and 40% proximal circumflex stenosis.   Preserved global LV function with EF estimated at 55% but with small area of mid inferior hypocontractility.  LVEDP 9 mmHg.   Successful PCI to the mid RCA with insertion of a 3.0 x 20 mm Synergy stent postdilated to 3.5 mm with the stenosis improved to 0%.  It is notable that the patient had significant coronary vasodilation following IC nitroglycerin.   RECOMMENDATION: DAPT for minimum of 6 months but preferably 12  months.  Medical therapy for concomitant CAD.  Smoking cessation is essential.  Aggressive lipid-lowering therapy with target LDL less than 70.  Plan for discharge later today if he remains stable.  Echo 11/13/21  1. Left ventricular ejection fraction, by estimation, is 60 to 65%. Left  ventricular ejection fraction by 3D volume is 65 %. The left ventricle has  normal function. The left ventricle has no regional wall motion  abnormalities. Left ventricular diastolic   parameters were normal. The average left ventricular global longitudinal  strain is -18.6 %. The global longitudinal strain is normal.   2. Right ventricular systolic function is normal. The right ventricular  size is normal.   3. The mitral valve is normal in structure. No evidence of mitral valve  regurgitation. No evidence of mitral stenosis.   4. The aortic valve is tricuspid. Aortic valve regurgitation is not  visualized. Aortic valve sclerosis/calcification is present, without any  evidence of aortic stenosis.   5. The inferior vena cava is normal in size with greater than 50%  respiratory variability, suggesting right atrial pressure of 3 mmHg.   Recent Labs: 09/18/2021: ALT 17 10/29/2021: BUN 13; Creatinine, Ser 0.94; Hemoglobin 14.1; Platelets 275; Potassium 4.6; Sodium 133  Recent Lipid Panel    Component Value Date/Time   CHOL 135 09/18/2021 1654   TRIG 272.0 (H) 09/18/2021 1654   HDL 52.40 09/18/2021 1654   CHOLHDL 3 09/18/2021 1654   VLDL 54.4 (H) 09/18/2021 1654   LDLDIRECT 60.0 09/18/2021 1654     Risk Assessment/Calculations:       Physical Exam:    VS:  BP 138/64   Pulse 67   Ht 5\' 9"  (1.753 m)   Wt 162 lb 9.6 oz (73.8 kg)   SpO2 98%   BMI 24.01 kg/m     Wt Readings from Last 3 Encounters:  11/20/21 162 lb 9.6 oz (73.8 kg)  11/07/21 162 lb (73.5 kg)  11/01/21 165 lb (74.8 kg)     GEN:  Well nourished, well developed in no acute distress HEENT: Normal NECK: No JVD; No carotid  bruits CARDIAC: RRR, no murmurs, rubs, gallops RESPIRATORY:  Clear to auscultation without rales, wheezing or rhonchi  ABDOMEN: Soft,  non-tender, non-distended MUSCULOSKELETAL:  Mild bilateral LE edema; No deformity. 2+ pedal pulses, equal bilaterally SKIN: Warm and dry. Right forearm with mild bruising. 2+ radial pulses equal bilaterally NEUROLOGIC:  Alert and oriented x 3 PSYCHIATRIC:  Normal affect   EKG:  EKG is ordered today.  The ekg ordered today demonstrates NSR at 64 bpm, no ST/T abnormality  Diagnoses:    1. Coronary artery disease involving native coronary artery of native heart with angina pectoris (HCC)   2. Essential hypertension   3. Hyperlipidemia LDL goal <70   4. Irregular heart rate   5. Tobacco abuse    Assessment and Plan:     CAD s/p DES to RCA with ?angina: Cardiac cath 7/27 with significant CAD involving large dominant RCA with successful PCI/DES x 1 to mid RCA, mild nonobstructive CAD in left coronary circulation with smooth 25% distal left main stenosis and 40% proximal circumflex stenosis. Reports 3 episodes of chest pain that have concerned him, associated with weakness and diaphoreis.  Felt similar to prior angina. Also has characteristics that could be non-anginal in nature. EKG unremarkable. Reviewed cath films with Dr. Eldridge Dace, DOD who did not feel further ischemic evaluation is warranted at this time. Advised patient to monitor BP as noted below prior to adding Imdur.  He questions whether symptoms could be anxiety. I have asked him to report back with BP readings and symptom diary in 1 week. Advised he can try Pepcid to alleviate s/s that could be caused from acid reflux. Insurance will not cover Brilinta. In 2 weeks he will transition from Brilinta to Plavix with loading dose of 300 mg and then 75 mg daily. Encouraged participation in cardiac rehab. 150 minutes moderate intensity exercise each week.   Hyperlipidemia LDL goal < 70: Direct LDL 60 09/18/21. Has  lab appointment for 9/6 to recheck fasting lipids. Continue atorvastatin.   Hypertension: BP is well-controlled today. Reports variations in home BP.  I have asked him to monitor on a more consistent basis at least 2 hours after medication and report back in 1 week.  Could consider adding Imdur, however do not want to significantly lower BP if it is already soft.   Tobacco abuse: Offered assistance. Has nicotine patches, not interested in quitting at this time. Gave him 800-[id number]. Complete cessation advised.   Irregular HR: Reports a mild chest discomfort that feels different than angina. Cardiac monitor is pending.  Continues to note occasional notation of irregular HR on home BP cuff.  Will contact him with results of cardiac monitor.  Disposition: 3 months with Dr. Shari Prows, sooner if chest pain worsens   Medication Adjustments/Labs and Tests Ordered: Current medicines are reviewed at length with the patient today.  Concerns regarding medicines are outlined above.  Orders Placed This Encounter  Procedures   EKG 12-Lead   Meds ordered this encounter  Medications   clopidogrel (PLAVIX) 75 MG tablet    Sig: Take 4 tablets (300 mg total) by mouth once for 1 dose. 12 hours after last ticagrelor dose    Dispense:  4 tablet    Refill:  0    Order Specific Question:   Supervising Provider    Answer:   Vesta Mixer [[id number]]   clopidogrel (PLAVIX) 75 MG tablet    Sig: Take 1 tablet (75 mg total) by mouth daily.    Dispense:  90 tablet    Refill:  4    Order Specific Question:   Supervising Provider  Answer:   Vesta Mixer [[id number]]    Patient Instructions  Medication Instructions:   DISCONTINUE Brilinta after last dose.   START Plavix four (4) tablets (300 mg) after your last dose of Brilinta 12 hours later than one (1) tablet by mouth ( 75 mg) daily.   *If you need a refill on your cardiac medications before your next appointment, please call your pharmacy*   Lab  Work:  Keep your upcoming lab appointment.   If you have labs (blood work) drawn today and your tests are completely normal, you will receive your results only by: MyChart Message (if you have MyChart) OR A paper copy in the mail If you have any lab test that is abnormal or we need to change your treatment, we will call you to review the results.   Testing/Procedures:  None ordered.   Follow-Up: At [facility], you and your health needs are our priority.  As part of our continuing mission to provide you with exceptional heart care, we have created designated Provider Care Teams.  These Care Teams include your primary Cardiologist (physician) and Advanced Practice Providers (APPs -  Physician Assistants and Nurse Practitioners) who all work together to provide you with the care you need, when you need it.  We recommend signing up for the patient portal called "MyChart".  Sign up information is provided on this After Visit Summary.  MyChart is used to connect with patients for Virtual Visits (Telemedicine).  Patients are able to view lab/test results, encounter notes, upcoming appointments, etc.  Non-urgent messages can be sent to your provider as well.   To learn more about what you can do with MyChart, go to [email or web address].    Your next appointment:   3 month(s)  The format for your next appointment:   In Person  Provider:   Meriam Sprague, MD     Other Instructions  1-800-QUIT NOW.  HOW TO TAKE YOUR BLOOD PRESSURE: Rest 5 minutes before taking your blood pressure.  Don't smoke or drink caffeinated beverages for at least 30 minutes before. Take your blood pressure before (not after) you eat. Sit comfortably with your back supported and both feet on the floor (don't cross your legs). Elevate your arm to heart level on a table or a desk. Use the proper sized cuff. It should fit smoothly and snugly around your bare upper arm. There should be enough room to slip  a fingertip under the cuff. The bottom edge of the cuff should be 1 inch above the crease of the elbow.  Blood Pressure Record Sheet To take your blood pressure, you will need a blood pressure machine. You may be prescribed one, or you can buy a blood pressure machine (blood pressure monitor) at your clinic, drug store, or online. When choosing one, look for these features: An automatic monitor that has an arm cuff. A cuff that wraps snugly, but not too tightly, around your upper arm. You should be able to fit only one finger between your arm and the cuff. A device that stores blood pressure reading results. Do not choose a monitor that measures your blood pressure from your wrist or finger. Follow your health care provider's instructions for how to take your blood pressure. To use this form: Get one reading in the morning (a.m.) before you take any medicines. Get one reading in the evening (p.m.) before supper. Take at least two readings with each blood pressure check. This makes sure the results are correct.  Wait 1-2 minutes between measurements. Write down the results in the spaces on this form. Repeat this once a week, or as told by your health care provider. Make a follow-up appointment with your health care provider to discuss the results. Blood pressure log Date: _______________________ a.m. _____________________(1st reading) _____________________(2nd reading) p.m. _____________________(1st reading) _____________________(2nd reading) Date: _______________________ a.m. _____________________(1st reading) _____________________(2nd reading) p.m. _____________________(1st reading) _____________________(2nd reading) Date: _______________________ a.m. _____________________(1st reading) _____________________(2nd reading) p.m. _____________________(1st reading) _____________________(2nd reading) Date: _______________________ a.m. _____________________(1st reading) _____________________(2nd  reading) p.m. _____________________(1st reading) _____________________(2nd reading) Date: _______________________ a.m. _____________________(1st reading) _____________________(2nd reading) p.m. _____________________(1st reading) _____________________(2nd reading) This information is not intended to replace advice given to you by your health care provider. Make sure you discuss any questions you have with your health care provider. Document Revised: 12/07/2020 Document Reviewed: 12/07/2020 Elsevier Patient Education  2023 Elsevier Inc.  Please call office at [phone] in one week to give Blood Pressure Readings, You can leave with a triage nurse.   Important Information About Sugar         Signed, Levi Aland, NP  11/20/2021 10:07 AM    [facility]

## 2021-11-20 ENCOUNTER — Encounter: Payer: Self-pay | Admitting: Nurse Practitioner

## 2021-11-20 ENCOUNTER — Other Ambulatory Visit (HOSPITAL_BASED_OUTPATIENT_CLINIC_OR_DEPARTMENT_OTHER): Payer: Self-pay

## 2021-11-20 ENCOUNTER — Ambulatory Visit (INDEPENDENT_AMBULATORY_CARE_PROVIDER_SITE_OTHER): Payer: 59 | Admitting: Nurse Practitioner

## 2021-11-20 VITALS — BP 138/64 | HR 67 | Ht 69.0 in | Wt 162.6 lb

## 2021-11-20 DIAGNOSIS — E785 Hyperlipidemia, unspecified: Secondary | ICD-10-CM | POA: Diagnosis not present

## 2021-11-20 DIAGNOSIS — I499 Cardiac arrhythmia, unspecified: Secondary | ICD-10-CM

## 2021-11-20 DIAGNOSIS — I25119 Atherosclerotic heart disease of native coronary artery with unspecified angina pectoris: Secondary | ICD-10-CM | POA: Diagnosis not present

## 2021-11-20 DIAGNOSIS — I10 Essential (primary) hypertension: Secondary | ICD-10-CM | POA: Diagnosis not present

## 2021-11-20 DIAGNOSIS — Z720 Tobacco use: Secondary | ICD-10-CM

## 2021-11-20 DIAGNOSIS — R002 Palpitations: Secondary | ICD-10-CM | POA: Diagnosis not present

## 2021-11-20 MED ORDER — CLOPIDOGREL BISULFATE 75 MG PO TABS
300.0000 mg | ORAL_TABLET | Freq: Once | ORAL | 0 refills | Status: AC
  Filled 2021-11-20: qty 4, 1d supply, fill #0

## 2021-11-20 MED ORDER — CLOPIDOGREL BISULFATE 75 MG PO TABS
75.0000 mg | ORAL_TABLET | Freq: Every day | ORAL | 4 refills | Status: DC
  Filled 2021-11-20: qty 90, 90d supply, fill #0
  Filled 2021-11-20 – 2021-12-03 (×2): qty 30, 30d supply, fill #0

## 2021-11-20 NOTE — Patient Instructions (Signed)
Medication Instructions:   DISCONTINUE Brilinta after last dose.   START Plavix four (4) tablets (300 mg) after your last dose of Brilinta 12 hours later than one (1) tablet by mouth ( 75 mg) daily.   *If you need a refill on your cardiac medications before your next appointment, please call your pharmacy*   Lab Work:  Keep your upcoming lab appointment.   If you have labs (blood work) drawn today and your tests are completely normal, you will receive your results only by: MyChart Message (if you have MyChart) OR A paper copy in the mail If you have any lab test that is abnormal or we need to change your treatment, we will call you to review the results.   Testing/Procedures:  None ordered.   Follow-Up: At [facility], you and your health needs are our priority.  As part of our continuing mission to provide you with exceptional heart care, we have created designated Provider Care Teams.  These Care Teams include your primary Cardiologist (physician) and Advanced Practice Providers (APPs -  Physician Assistants and Nurse Practitioners) who all work together to provide you with the care you need, when you need it.  We recommend signing up for the patient portal called "MyChart".  Sign up information is provided on this After Visit Summary.  MyChart is used to connect with patients for Virtual Visits (Telemedicine).  Patients are able to view lab/test results, encounter notes, upcoming appointments, etc.  Non-urgent messages can be sent to your provider as well.   To learn more about what you can do with MyChart, go to [email or web address].    Your next appointment:   3 month(s)  The format for your next appointment:   In Person  Provider:   Meriam Sprague, MD     Other Instructions  1-800-QUIT NOW.  HOW TO TAKE YOUR BLOOD PRESSURE: Rest 5 minutes before taking your blood pressure.  Don't smoke or drink caffeinated beverages for at least 30 minutes before. Take  your blood pressure before (not after) you eat. Sit comfortably with your back supported and both feet on the floor (don't cross your legs). Elevate your arm to heart level on a table or a desk. Use the proper sized cuff. It should fit smoothly and snugly around your bare upper arm. There should be enough room to slip a fingertip under the cuff. The bottom edge of the cuff should be 1 inch above the crease of the elbow.  Blood Pressure Record Sheet To take your blood pressure, you will need a blood pressure machine. You may be prescribed one, or you can buy a blood pressure machine (blood pressure monitor) at your clinic, drug store, or online. When choosing one, look for these features: An automatic monitor that has an arm cuff. A cuff that wraps snugly, but not too tightly, around your upper arm. You should be able to fit only one finger between your arm and the cuff. A device that stores blood pressure reading results. Do not choose a monitor that measures your blood pressure from your wrist or finger. Follow your health care provider's instructions for how to take your blood pressure. To use this form: Get one reading in the morning (a.m.) before you take any medicines. Get one reading in the evening (p.m.) before supper. Take at least two readings with each blood pressure check. This makes sure the results are correct. Wait 1-2 minutes between measurements. Write down the results in the spaces on this  form. Repeat this once a week, or as told by your health care provider. Make a follow-up appointment with your health care provider to discuss the results. Blood pressure log Date: _______________________ a.m. _____________________(1st reading) _____________________(2nd reading) p.m. _____________________(1st reading) _____________________(2nd reading) Date: _______________________ a.m. _____________________(1st reading) _____________________(2nd reading) p.m. _____________________(1st  reading) _____________________(2nd reading) Date: _______________________ a.m. _____________________(1st reading) _____________________(2nd reading) p.m. _____________________(1st reading) _____________________(2nd reading) Date: _______________________ a.m. _____________________(1st reading) _____________________(2nd reading) p.m. _____________________(1st reading) _____________________(2nd reading) Date: _______________________ a.m. _____________________(1st reading) _____________________(2nd reading) p.m. _____________________(1st reading) _____________________(2nd reading) This information is not intended to replace advice given to you by your health care provider. Make sure you discuss any questions you have with your health care provider. Document Revised: 12/07/2020 Document Reviewed: 12/07/2020 Elsevier Patient Education  2023 Elsevier Inc.  Please call office at [phone] in one week to give Blood Pressure Readings, You can leave with a triage nurse.   Important Information About Sugar

## 2021-11-29 ENCOUNTER — Telehealth: Payer: Self-pay | Admitting: Medical

## 2021-11-29 NOTE — Telephone Encounter (Signed)
Eric Gillespie,  Thanks for seeing our patient. Needed  your help with Eric Gillespie as we approach the weekend. I am not in the office this week/am at conference. Message by my chart below in "  "Patient's wife called stating her husband is out of his Marden Noble which was given to him in the hospital. She stated that the hospital advised her to reach out to PCP in order to get a refill and that their insurance only covers the generic brand of the medication. She would like [facility] day [facility] Please advise."  I saw your note which is different so I think they need some clarification. I am asking for them to call cardiology office tomorrow. I was not sure whether they had the prescription of the plavix or they are completely out of brillinta.  Again I appreciate you help. It will be hard for to coordinate this. It sounds like he has the plavix prescription but maybe not understanding instruction on how to transition.  Thanks again,  [employer], PA-C

## 2021-11-29 NOTE — Telephone Encounter (Signed)
Patient's wife called stating her husband is out of his Marden Noble which was given to him in the hospital. She stated that the hospital advised her to reach out to PCP in order to get a refill and that their insurance only covers the generic brand of the medication. She would like a 90 day [facility]. Please advise.    ticagrelor (BRILINTA) 90 MG TABS tablet [[id number]]

## 2021-11-30 ENCOUNTER — Ambulatory Visit: Payer: 59 | Admitting: Nurse Practitioner

## 2021-11-30 NOTE — Telephone Encounter (Addendum)
S/w pt's wife per (DPR) to clarify medication changes.  Pt still has 6 days of Brilinta left, once pt finishes Brilinta 12 hours later pt will take Plavix four (4 ) tablets by mouth ( 300 mg ) for loading dose than will continue daily one (1) tablet ( 75 mg ) daily. The plavix was sent in for # 90 on Aug 15 to the [facility] in [place].  Pt's wife is aware. Sent to [employer], [place] via [employer] [employer] to [place].

## 2021-11-30 NOTE — Telephone Encounter (Signed)
Called [facility] and joyce left voicemail to return call to go over cardiology's instructions

## 2021-11-30 NOTE — Telephone Encounter (Signed)
Note from 11/20/2021    ---   Medication Instructions:    DISCONTINUE Brilinta after last dose.    START Plavix four (4) tablets (300 mg) after your last dose of Brilinta 12 hours later than one (1) tablet by mouth ( 75 mg) daily.

## 2021-12-03 ENCOUNTER — Telehealth: Payer: Self-pay | Admitting: Medical

## 2021-12-03 ENCOUNTER — Other Ambulatory Visit (HOSPITAL_BASED_OUTPATIENT_CLINIC_OR_DEPARTMENT_OTHER): Payer: Self-pay

## 2021-12-03 MED ORDER — CLOPIDOGREL BISULFATE 75 MG PO TABS: 75.0000 mg | ORAL_TABLET | Freq: Every day | ORAL | 4 refills | Status: DC

## 2021-12-03 MED ORDER — ATORVASTATIN CALCIUM 80 MG PO TABS: 80.0000 mg | ORAL_TABLET | Freq: Every day | ORAL | 1 refills | Status: DC

## 2021-12-03 MED ORDER — ATORVASTATIN CALCIUM 80 MG PO TABS
80.0000 mg | ORAL_TABLET | Freq: Every day | ORAL | 1 refills | Status: DC
  Filled 2021-12-03: qty 90, 90d supply, fill #0

## 2021-12-03 NOTE — Telephone Encounter (Signed)
Patient's wife called to advise that patient made a mistake when he called for his prescription. clopidogrel (PLAVIX) 75 MG tablet already picked up from pharmacy but it was only for 30 days so they would like a 90 day supply to be sent to [facility]. Also, please pull the atorvastatin (LIPITOR) 80 MG tablet prescription back from [facility] and send it [facility].   [facility] - [place], [place] - One [facility] AT Portal to Registered [address]   One [place], [place] [address]  Phone:  [phone]  Fax:  [phone]

## 2021-12-03 NOTE — Telephone Encounter (Signed)
Lipitor sent to [facility] per [facility] request Plavix already at pharmacy

## 2021-12-03 NOTE — Telephone Encounter (Signed)
Medication: clopidogrel (PLAVIX) 75 MG tablet   atorvastatin (LIPITOR) 80 MG tablet   Has the patient contacted their pharmacy? No.  Preferred Pharmacy:  [facility] - [place], [place] - One [facility] AT Portal to Registered [address]   One [place], [place] [address]  Phone:  [phone]  Fax:  [phone]

## 2021-12-03 NOTE — Telephone Encounter (Signed)
Scripts sent to [facility]

## 2021-12-03 NOTE — Telephone Encounter (Signed)
Spoke with Eric Gillespie made him aware of the plavix being at [facility]

## 2021-12-04 NOTE — Progress Notes (Signed)
Pt has been made aware of normal result and verbalized understanding.  jw

## 2021-12-05 ENCOUNTER — Ambulatory Visit: Payer: 59 | Admitting: Medical

## 2021-12-11 DIAGNOSIS — L720 Epidermal cyst: Secondary | ICD-10-CM | POA: Diagnosis not present

## 2021-12-12 ENCOUNTER — Ambulatory Visit: Payer: 59 | Attending: Cardiology

## 2021-12-12 DIAGNOSIS — R079 Chest pain, unspecified: Secondary | ICD-10-CM | POA: Diagnosis not present

## 2021-12-12 DIAGNOSIS — R002 Palpitations: Secondary | ICD-10-CM

## 2021-12-12 DIAGNOSIS — I10 Essential (primary) hypertension: Secondary | ICD-10-CM | POA: Diagnosis not present

## 2021-12-12 DIAGNOSIS — Z79899 Other long term (current) drug therapy: Secondary | ICD-10-CM | POA: Diagnosis not present

## 2021-12-12 DIAGNOSIS — E785 Hyperlipidemia, unspecified: Secondary | ICD-10-CM

## 2021-12-12 DIAGNOSIS — R072 Precordial pain: Secondary | ICD-10-CM | POA: Diagnosis not present

## 2021-12-12 LAB — LIPID PANEL
Chol/HDL Ratio: 2.8 ratio (ref 0.0–5.0)
Cholesterol, Total: 121 mg/dL (ref 100–199)
HDL: 43 mg/dL (ref >39)
LDL Chol Calc (NIH): 46 mg/dL (ref 0–99)
Triglycerides: 198 mg/dL — ABNORMAL HIGH (ref 0–149)
VLDL Cholesterol Cal: 32 mg/dL (ref 5–40)

## 2022-01-29 ENCOUNTER — Encounter: Payer: Self-pay | Admitting: Internal Medicine

## 2022-01-29 ENCOUNTER — Ambulatory Visit (INDEPENDENT_AMBULATORY_CARE_PROVIDER_SITE_OTHER): Payer: 59 | Admitting: Internal Medicine

## 2022-01-29 VITALS — BP 146/90 | HR 67 | Ht 69.0 in | Wt 168.0 lb

## 2022-01-29 DIAGNOSIS — E291 Testicular hypofunction: Secondary | ICD-10-CM | POA: Diagnosis not present

## 2022-01-29 DIAGNOSIS — E230 Hypopituitarism: Secondary | ICD-10-CM

## 2022-01-29 LAB — COMPREHENSIVE METABOLIC PANEL
ALT: 17 U/L (ref 0–53)
AST: 30 U/L (ref 0–37)
Albumin: 4.7 g/dL (ref 3.5–5.2)
Alkaline Phosphatase: 100 U/L (ref 39–117)
BUN: 15 mg/dL (ref 6–23)
CO2: 25 mEq/L (ref 19–32)
Calcium: 10.1 mg/dL (ref 8.4–10.5)
Chloride: 98 mEq/L (ref 96–112)
Creatinine, Ser: 0.87 mg/dL (ref 0.40–1.50)
GFR: 91.61 mL/min (ref >60.00)
Glucose, Bld: 102 mg/dL — ABNORMAL HIGH (ref 70–99)
Potassium: 4.8 mEq/L (ref 3.5–5.1)
Sodium: 132 mEq/L — ABNORMAL LOW (ref 135–145)
Total Bilirubin: 0.4 mg/dL (ref 0.2–1.2)
Total Protein: 8.4 g/dL — ABNORMAL HIGH (ref 6.0–8.3)

## 2022-01-29 LAB — CBC
HCT: 43.7 % (ref 39.0–52.0)
Hemoglobin: 14.5 g/dL (ref 13.0–17.0)
MCHC: 33.3 g/dL (ref 30.0–36.0)
MCV: 102.1 fl — ABNORMAL HIGH (ref 78.0–100.0)
Platelets: 303 10*3/uL (ref 150.0–400.0)
RBC: 4.28 Mil/uL (ref 4.22–5.81)
RDW: 12.8 % (ref 11.5–15.5)
WBC: 8.5 10*3/uL (ref 4.0–10.5)

## 2022-01-29 LAB — T4, FREE: Free T4: 0.67 ng/dL (ref 0.60–1.60)

## 2022-01-29 LAB — LUTEINIZING HORMONE: LH: 0.65 m[IU]/mL — ABNORMAL LOW (ref 1.50–9.30)

## 2022-01-29 LAB — TSH: TSH: 3.42 u[IU]/mL (ref 0.35–5.50)

## 2022-01-29 NOTE — Progress Notes (Signed)
Name: Eric Gillespie  MRN/ DOB: [id number], 1955/07/01    Age/ Sex: 66 y.o., male    PCP: Marisue Brooklyn   Reason for Endocrinology Evaluation: Hypogonadism     Date of Initial Endocrinology Evaluation: 01/29/2022     HPI: Mr. Eric Gillespie is a 66 y.o. male with a past medical history of CAD, and HTN. The patient presented for initial endocrinology clinic visit on 01/29/2022 for consultative assistance with his Hypogonadism .   Patient was referred here for further evaluation of hypogonadism with a total testosterone of 132 in February 2023   Of note the patient has been following up with cardiology for coronary artery disease, he is s/p cardiac cath with 75% stenosis of the RCA, requiring PCI/DES 10/2021   Pt was on testosterone gel years ago , but it made him " evil"    Has had chronic erectile dysfunction for ~ 3 years ago  Libido is stable   Denies cannabis use  No narcotic use  Denies headaches except rarely  Has visual changes  Has 2 biological kids  in their 40's  Has noted decrease in facial hair growth  Nocturia x  Denies thromboembolic events  He is a smoker  Questionable father with testicular  cancer   No Dx of osteoporosis    HISTORY:  Past Medical History:  Past Medical History:  Diagnosis Date   Allergy    Anxiety    Hyperlipidemia    Hypertension    Past Surgical History:  Past Surgical History:  Procedure Laterality Date   CORONARY STENT INTERVENTION N/A 11/01/2021   Procedure: CORONARY STENT INTERVENTION;  Surgeon: Lennette Bihari, MD;  Location: [facility];  Service: Cardiovascular;  Laterality: N/A;   LEFT HEART CATH AND CORONARY ANGIOGRAPHY N/A 11/01/2021   Procedure: LEFT HEART CATH AND CORONARY ANGIOGRAPHY;  Surgeon: Lennette Bihari, MD;  Location: [facility];  Service: Cardiovascular;  Laterality: N/A;   TONSILLECTOMY      Social History:  reports that he has been smoking cigarettes. He has been smoking an  average of 1 pack per day. He has never used smokeless tobacco. He reports current alcohol use. He reports that he does not currently use drugs. Family History: family history includes Arthritis in his father; Diabetes in his daughter and mother.   HOME MEDICATIONS: Allergies as of 01/29/2022   No Known Allergies      Medication List        Accurate as of January 29, 2022  8:39 AM. If you have any questions, ask your nurse or doctor.          aspirin EC 81 MG tablet Take 81 mg by mouth daily.   atorvastatin 10 MG tablet Commonly known as: LIPITOR Take 10 mg by mouth daily.   atorvastatin 80 MG tablet Commonly known as: LIPITOR Take 1 tablet (80 mg total) by mouth daily.   clopidogrel 75 MG tablet Commonly known as: PLAVIX Take 1 tablet (75 mg total) by mouth daily.   fluorouracil 5 % cream Commonly known as: Efudex Apply 1(one) application topically 2(two) times a day for 2-4 weeks   LORazepam 0.5 MG tablet Commonly known as: ATIVAN Take 1 tablet (0.5 mg total) by mouth 2 (two) times daily as needed for anxiety.   losartan 50 MG tablet Commonly known as: COZAAR Take 1 tablet (50 mg total) by mouth daily.   multivitamin with minerals Tabs tablet Take 1 tablet by  mouth daily.   nebivolol 10 MG tablet Commonly known as: BYSTOLIC Take 1 tablet (10 mg total) by mouth daily for 14 days.   nicotine 21 mg/24hr patch Commonly known as: NICODERM CQ - dosed in mg/24 hours Place 1 patch (21 mg total) onto the skin daily.   nitroGLYCERIN 0.4 MG SL tablet Commonly known as: Nitrostat Place 1 tablet (0.4 mg total) under the tongue every 5 (five) minutes as needed.   venlafaxine XR 37.5 MG 24 hr capsule Commonly known as: Effexor XR Take 1 capsule (37.5 mg total) by mouth daily with breakfast.          REVIEW OF SYSTEMS: A comprehensive ROS was conducted with the patient and is negative except as per HPI     OBJECTIVE:  VS: BP (!) 146/90 (BP Location: Left  Arm, Patient Position: Sitting, Cuff Size: Large)   Pulse 67   Ht 5\' 9"  (1.753 m)   Wt 168 lb (76.2 kg)   SpO2 98%   BMI 24.81 kg/m    Wt Readings from Last 3 Encounters:  01/29/22 168 lb (76.2 kg)  11/20/21 162 lb 9.6 oz (73.8 kg)  11/07/21 162 lb (73.5 kg)     EXAM: General: Pt appears well and is in NAD  Eyes: External eye exam normal without stare, lid lag or exophthalmos.  EOM intact.    Neck: General: Supple without adenopathy. Thyroid: Thyroid size normal.  No goiter or nodules appreciated.   Lungs: Clear with good BS bilat with no rales, rhonchi, or wheezes  Heart: Auscultation: RRR.  Abdomen: Normoactive bowel sounds, soft, nontender, without masses or organomegaly palpable  Genital exam   Normal external exam with left testicular size ~  15 mL   Extremities:  BL LE: No pretibial edema normal ROM and strength.  Mental Status: Judgment, insight: Intact Orientation: Oriented to time, place, and person Mood and affect: No depression, anxiety, or agitation     DATA REVIEWED:     Latest Reference Range & Units 01/29/22 09:05  Sodium 135 - 145 mEq/L 132 (L)  Potassium 3.5 - 5.1 mEq/L 4.8  Chloride 96 - 112 mEq/L 98  CO2 19 - 32 mEq/L 25  Glucose 70 - 99 mg/dL 01/31/22 (H)  BUN 6 - 23 mg/dL 15  Creatinine 191 - 4.78 mg/dL 2.95  Calcium 8.4 - 6.21 mg/dL 30.8  Alkaline Phosphatase 39 - 117 U/L 100  Albumin 3.5 - 5.2 g/dL 4.7  AST 0 - 37 U/L 30  ALT 0 - 53 U/L 17  Total Protein 6.0 - 8.3 g/dL 8.4 (H)  Total Bilirubin 0.2 - 1.2 mg/dL 0.4  GFR 65.7 mL/min 91.61  WBC 4.0 - 10.5 K/uL 8.5  RBC 4.22 - 5.81 Mil/uL 4.28  Hemoglobin 13.0 - 17.0 g/dL >84.69  HCT 62.9 - 52.8 % 43.7  MCV 78.0 - 100.0 fl 102.1 (H)  MCHC 30.0 - 36.0 g/dL 41.3  RDW 24.4 - 01.0 % 12.8  Platelets 150.0 - 400.0 K/uL 303.0  LH 1.50 - 9.30 mIU/mL 0.65 (L)  Prolactin 2.0 - 18.0 ng/mL 8.8  TSH 0.35 - 5.50 uIU/mL 3.42  T4,Free(Direct) 0.60 - 1.60 ng/dL 27.2    ASSESSMENT/PLAN/RECOMMENDATIONS:    Hypogonadism:  - This is chronic in nature  - Pt is NOT interested in testosterone replacement therapy as it made him "evil"  -I will check his labs to rule out any secondary causes for hypogonadism, based on his labs he may or may not qualify for clomiphene, we  discussed the risk of thromboembolic events with this as well as is off label use -We discussed side effects of testosterone therapy such as erythropoiesis, worsening of sleep apnea that is severe and untreated,  Prostate volumes and serum PSA increase in response to testosterone treatment which might increase BPH and worsen urinary outflow obstruction as well as prostate cancer risk.  - We also discussed there is a possibility of increased cardiovascular risk associated with testosterone use. -We also discussed the benefit of testosterone therapy in the setting of bone health and general wellbeing -The patient may opt to see urology for symptomatic treatment of erectile dysfunction -His LH is low, which indicates pituitary cause for hypogonadism, testosterone is pending -We will proceed with pituitary imaging  Follow-up pending lab results Signed electronically by: Mack Guise, MD  [facility]  [facility] [address] [place], [place] [address] Phone: [phone] FAX: [phone]   CC: Elise Benne [address] [place] [address] Phone: [phone] Fax: [phone]   Return to Endocrinology clinic as below: Future Appointments  Date Time Provider [place]  02/21/2022  9:00 AM Freada Bergeron, MD [facility]

## 2022-01-31 ENCOUNTER — Telehealth: Payer: Self-pay | Admitting: Internal Medicine

## 2022-01-31 NOTE — Telephone Encounter (Signed)
Please let the  pt know that his testosterone is not back yet but I know it will be low because one of his pituitary hormones are low    Based on that information  he need a brain MRI to check on his pituitary gland and make sure there's no tumor pressing on  it    Let me know if he is ok with this , so I can order it    Thanks

## 2022-01-31 NOTE — Telephone Encounter (Signed)
Patient would like to move forward with the MRI

## 2022-01-31 NOTE — Telephone Encounter (Signed)
LMTCB for results. 

## 2022-02-02 LAB — TESTOSTERONE, TOTAL, LC/MS/MS: Testosterone, Total, LC-MS-MS: 42 ng/dL — ABNORMAL LOW (ref 250–1100)

## 2022-02-02 LAB — PROLACTIN: Prolactin: 8.8 ng/mL (ref 2.0–18.0)

## 2022-02-18 NOTE — Progress Notes (Unsigned)
Cardiology Office Note:    Date:  02/18/2022   ID:  Eric Gillespie, DOB [birth year], MRN [id number]  PCP:  Saguier, Edward, [facility] Cardiologist:  Freada Bergeron, MD {  Referring MD: Mackie Pai, PA-C    History of Present Illness:    Eric Gillespie is a 66 y.o. male with a hx of HTN, HLD, tobacco abuse, and anxiety who presents to clinic for follow-up.  Patient was initially seen in clinic 10/2021 for several episodes of chest pain with concern for progressive angina given multivessel CAD on CT chest. He ultimately underwent cardiac catheterization on 11/01/2021, which revealed 75% mid RCA disease as well as nonobstructive CAD in the left coronary circulation with 25% distal left main stenosis and 40% proximal circumflex stenosis. He underwent successful PCI to RCA with excellent angiographic results. Was discharged on DAPT. TTE 11/2021 showed LVEF 60-65%, normal RV, no significant valve disease.   Was seen in follow-up by Christen Bame, NP where he had continued to have chest discomfort and palpitations. Cardiac monitor 11/2021 showed NSR, 4 runs of nonsustained SVT with longest lasting 14.4s, rare SVE and VE.  Today, ***  Past Medical History:  Diagnosis Date   Allergy    Anxiety    Hyperlipidemia    Hypertension     Past Surgical History:  Procedure Laterality Date   CORONARY STENT INTERVENTION N/A 11/01/2021   Procedure: CORONARY STENT INTERVENTION;  Surgeon: Troy Sine, MD;  Location: [facility];  Service: Cardiovascular;  Laterality: N/A;   LEFT HEART CATH AND CORONARY ANGIOGRAPHY N/A 11/01/2021   Procedure: LEFT HEART CATH AND CORONARY ANGIOGRAPHY;  Surgeon: Troy Sine, MD;  Location: [facility];  Service: Cardiovascular;  Laterality: N/A;   TONSILLECTOMY      Current Medications: No outpatient medications have been marked as taking for the 02/21/22 encounter (Appointment) with Freada Bergeron, MD.      Allergies:   Patient has no known allergies.   Social History   Socioeconomic History   Marital status: Married    Spouse name: Not on file   Number of children: Not on file   Years of education: Not on file   Highest education level: Not on file  Occupational History   Not on file  Tobacco Use   Smoking status: Every Day    Packs/day: 1.00    Types: Cigarettes   Smokeless tobacco: Never  Vaping Use   Vaping Use: Never used  Substance and Sexual Activity   Alcohol use: Yes   Drug use: Not Currently   Sexual activity: Yes  Other Topics Concern   Not on file  Social History Narrative   Not on file   Social Determinants of Health   Financial Resource Strain: Not on file  Food Insecurity: Not on file  Transportation Needs: Not on file  Physical Activity: Not on file  Stress: Not on file  Social Connections: Not on file     Family History: The patient's family history includes Arthritis in his father; Diabetes in his daughter and mother.  ROS:   Please see the history of present illness.     All other systems reviewed and are negative.  EKGs/Labs/Other Studies Reviewed:    The following studies were reviewed today: Cath 10/2021: CORONARY STENT INTERVENTION  LEFT HEART CATH AND CORONARY ANGIOGRAPHY   Conclusion      Prox RCA lesion is 30% stenosed.   Mid RCA-1  lesion is 75% stenosed.   Mid RCA-2 lesion is 40% stenosed.   Prox Cx lesion is 40% stenosed.   Mid LM to Dist LM lesion is 25% stenosed.   A drug-eluting stent was successfully placed.   Post intervention, there is a 0% residual stenosis.   Post intervention, there is a 0% residual stenosis.   The left ventricular systolic function is normal.   LV end diastolic pressure is normal.   Significant CAD involving a large dominant RCA with 30% smooth proximal stenosis, and 75% eccentric stenosis in the RCA followed by 40% narrowing.   Mild nonobstructive CAD in the left coronary circulation with  smooth 25% distal left main stenosis and 40% proximal circumflex stenosis.   Preserved global LV function with EF estimated at 55% but with small area of mid inferior hypocontractility.  LVEDP 9 mmHg.   Successful PCI to the mid RCA with insertion of a 3.0 x 20 mm Synergy stent postdilated to 3.5 mm with the stenosis improved to 0%.  It is notable that the patient had significant coronary vasodilation following IC nitroglycerin.   RECOMMENDATION: DAPT for minimum of 6 months but preferably 12 months.  Medical therapy for concomitant CAD.  Smoking cessation is essential.  Aggressive lipid-lowering therapy with target LDL less than 70.  Plan for discharge later today if he remains stable.  TTE 11/2021: IMPRESSIONS     1. Left ventricular ejection fraction, by estimation, is 60 to 65%. Left  ventricular ejection fraction by 3D volume is 65 %. The left ventricle has  normal function. The left ventricle has no regional wall motion  abnormalities. Left ventricular diastolic   parameters were normal. The average left ventricular global longitudinal  strain is -18.6 %. The global longitudinal strain is normal.   2. Right ventricular systolic function is normal. The right ventricular  size is normal.   3. The mitral valve is normal in structure. No evidence of mitral valve  regurgitation. No evidence of mitral stenosis.   4. The aortic valve is tricuspid. Aortic valve regurgitation is not  visualized. Aortic valve sclerosis/calcification is present, without any  evidence of aortic stenosis.   5. The inferior vena cava is normal in size with greater than 50%  respiratory variability, suggesting right atrial pressure of 3 mmHg.    Cardiac Monitor 11/2021:    Patch wear time was 6 days and 22 hours   Predominant rhythm is NSR with average HR 74bpm   4 runs of nonsustained SVT with longest lasting 14.4s   Rare SVE (<1%), rare VE (<1%)   No sustained arrhythmias or significant pauses      Patch Wear Time:  6 days and 22 hours (2023-07-30T15:14:37-0400 to 2023-08-06T13:52:35-0400)   Patient had a min HR of 51 bpm, max HR of 143 bpm, and avg HR of 74 bpm. Predominant underlying rhythm was Sinus Rhythm. 4 Supraventricular Tachycardia runs occurred, the run with the fastest interval lasting 4 beats with a max rate of 143 bpm, the  longest lasting 14.4 secs with an avg rate of 93 bpm. Isolated SVEs were rare (<1.0%), SVE Couplets were rare (<1.0%), and SVE Triplets were rare (<1.0%). Isolated VEs were rare (<1.0%), VE Couplets were rare (<1.0%), and no VE Triplets were present.  Ventricular Bigeminy was present.   CT chest 05/2021: FINDINGS: Cardiovascular: Heart size is normal. There is no significant pericardial fluid, thickening or pericardial calcification. There is aortic atherosclerosis, as well as atherosclerosis of the great vessels of the  mediastinum and the coronary arteries, including calcified atherosclerotic plaque in the left main, left anterior descending and right coronary arteries.   Mediastinum/Nodes: No pathologically enlarged mediastinal or hilar lymph nodes. Please note that accurate exclusion of hilar adenopathy is limited on noncontrast CT scans. Esophagus is unremarkable in appearance. No axillary lymphadenopathy.   Lungs/Pleura: No suspicious appearing pulmonary nodules or masses are noted. No acute consolidative airspace disease. No pleural effusions. Diffuse bronchial wall thickening with mild centrilobular and paraseptal emphysema   Upper Abdomen: Aortic atherosclerosis.   Musculoskeletal: Bilateral gynecomastia. There are no aggressive appearing lytic or blastic lesions noted in the visualized portions of the skeleton.   IMPRESSION: 1. Lung-RADS 1S, negative. Continue annual screening with low-dose chest CT without contrast in 12 months. 2. The "S" modifier above refers to potentially clinically significant non lung cancer related findings.  Specifically, there is aortic atherosclerosis, in addition to left main and 2 vessel coronary artery disease. Please note that although the presence of coronary artery calcium documents the presence of coronary artery disease, the severity of this disease and any potential stenosis cannot be assessed on this non-gated CT examination. Assessment for potential risk factor modification, dietary therapy or pharmacologic therapy may be warranted, if clinically indicated. 3. Mild diffuse bronchial wall thickening with mild centrilobular and paraseptal emphysema; imaging findings suggestive of underlying COPD.   Aortic Atherosclerosis (ICD10-I70.0) and Emphysema (ICD10-J43.9).  EKG:  ECG 10/17/21 with sinus bradycardia. No acute ischemic changes  Recent Labs: 01/29/2022: ALT 17; BUN 15; Creatinine, Ser 0.87; Hemoglobin 14.5; Platelets 303.0; Potassium 4.8; Sodium 132; TSH 3.42  Recent Lipid Panel    Component Value Date/Time   CHOL 121 12/12/2021 1048   TRIG 198 (H) 12/12/2021 1048   HDL 43 12/12/2021 1048   CHOLHDL 2.8 12/12/2021 1048   CHOLHDL 3 09/18/2021 1654   VLDL 54.4 (H) 09/18/2021 1654   LDLCALC 46 12/12/2021 1048   LDLDIRECT 60.0 09/18/2021 1654     Risk Assessment/Calculations:           Physical Exam:    VS:  There were no vitals taken for this visit.    Wt Readings from Last 3 Encounters:  01/29/22 168 lb (76.2 kg)  11/20/21 162 lb 9.6 oz (73.8 kg)  11/07/21 162 lb (73.5 kg)     GEN:  Well nourished, well developed in no acute distress HEENT: Normal NECK: No JVD; No carotid bruits CARDIAC: RRR, 1/6 systolic murmur RESPIRATORY:  Clear to auscultation without rales, wheezing or rhonchi  ABDOMEN: Soft, non-tender, non-distended MUSCULOSKELETAL:  No edema; No deformity  SKIN: Trace ankle edema NEUROLOGIC:  Alert and oriented x 3 PSYCHIATRIC:  Normal affect   ASSESSMENT:    No diagnosis found.  PLAN:    In order of problems listed above:  #CAD: Cath  with 75% mid-RCA s/p PCI with mild nonobstructive disease elsewhere. Currently, *** -Continue ASA 81mg  daily -Continue plavix 75mg  daily -Continue lipitor *** -Continue bystolic 10mg  daily -Continue losartan 50mg  daily  #Palpitations: Zio with nonsustained runs of SVT lasting up to 14s but overall rare ectopy and no Afib. Currently, *** -Continue bystolic 10mg  daily  #HTN: Well controlled.  -Continue losartan 50mg  daily -Continue bystolic 10mg  daily  #HLD: -Continue lipitor 80mg  daily -LDL well controlled 46  #Tobacco Abuse: -Tobacco cessation encouraged        Medication Adjustments/Labs and Tests Ordered: Current medicines are reviewed at length with the patient today.  Concerns regarding medicines are outlined above.  No orders of the defined  types were placed in this encounter.  No orders of the defined types were placed in this encounter.   There are no Patient Instructions on file for this visit.   Signed, Freada Bergeron, MD  02/18/2022 2:22 PM    [place]

## 2022-02-21 ENCOUNTER — Encounter: Payer: Self-pay | Admitting: Cardiology

## 2022-02-21 ENCOUNTER — Other Ambulatory Visit: Payer: Self-pay | Admitting: Cardiology

## 2022-02-21 ENCOUNTER — Other Ambulatory Visit (HOSPITAL_BASED_OUTPATIENT_CLINIC_OR_DEPARTMENT_OTHER): Payer: Self-pay

## 2022-02-21 ENCOUNTER — Ambulatory Visit: Payer: 59 | Attending: Cardiology | Admitting: Cardiology

## 2022-02-21 VITALS — BP 132/80 | HR 56 | Ht 69.0 in | Wt 167.0 lb

## 2022-02-21 DIAGNOSIS — I10 Essential (primary) hypertension: Secondary | ICD-10-CM

## 2022-02-21 DIAGNOSIS — E785 Hyperlipidemia, unspecified: Secondary | ICD-10-CM | POA: Diagnosis not present

## 2022-02-21 DIAGNOSIS — Z720 Tobacco use: Secondary | ICD-10-CM | POA: Diagnosis not present

## 2022-02-21 DIAGNOSIS — Z955 Presence of coronary angioplasty implant and graft: Secondary | ICD-10-CM

## 2022-02-21 DIAGNOSIS — Z79899 Other long term (current) drug therapy: Secondary | ICD-10-CM | POA: Diagnosis not present

## 2022-02-21 DIAGNOSIS — I25118 Atherosclerotic heart disease of native coronary artery with other forms of angina pectoris: Secondary | ICD-10-CM

## 2022-02-21 DIAGNOSIS — R002 Palpitations: Secondary | ICD-10-CM | POA: Diagnosis not present

## 2022-02-21 MED ORDER — LOSARTAN POTASSIUM 100 MG PO TABS: 100.0000 mg | ORAL_TABLET | Freq: Every day | ORAL | 3 refills | Status: DC

## 2022-02-21 MED ORDER — FUROSEMIDE 20 MG PO TABS
20.0000 mg | ORAL_TABLET | Freq: Every day | ORAL | 3 refills | Status: DC
  Filled 2022-02-21 – 2022-03-05 (×2): qty 30, 30d supply, fill #0
  Filled 2022-07-30: qty 90, 90d supply, fill #1
  Filled 2022-10-15: qty 90, 90d supply, fill #2

## 2022-02-21 MED ORDER — CLOPIDOGREL BISULFATE 75 MG PO TABS
75.0000 mg | ORAL_TABLET | Freq: Every day | ORAL | 4 refills | Status: DC
  Filled 2022-02-21: qty 30, 30d supply, fill #0

## 2022-02-21 MED ORDER — NEBIVOLOL HCL 10 MG PO TABS
10.0000 mg | ORAL_TABLET | Freq: Every day | ORAL | 0 refills | Status: DC
  Filled 2022-02-21 – 2022-08-07 (×2): qty 14, 14d supply, fill #0

## 2022-02-21 MED ORDER — LOSARTAN POTASSIUM 100 MG PO TABS
100.0000 mg | ORAL_TABLET | Freq: Every day | ORAL | 3 refills | Status: DC
  Filled 2022-02-21: qty 30, 30d supply, fill #0

## 2022-02-21 MED ORDER — ATORVASTATIN CALCIUM 80 MG PO TABS
80.0000 mg | ORAL_TABLET | Freq: Every day | ORAL | 1 refills | Status: DC
  Filled 2022-02-21: qty 30, 30d supply, fill #0

## 2022-02-21 NOTE — Progress Notes (Signed)
Cardiology Office Note:    Date:  02/21/2022   ID:  Eric Gillespie, DOB 07/19/1955, MRN [id number]  PCP:  Marisue BrooklynSaguier, Edward, PA-C   [facility] Providers Cardiologist:  Meriam SpragueHeather E Cornie Herrington, MD {  Referring MD: Esperanza RichtersSaguier, Edward, PA-C    History of Present Illness:    Eric Gillespie is a 66 y.o. male with a hx of HTN, HLD, tobacco abuse, and anxiety who presents to clinic for follow-up.  Patient was initially seen in clinic 10/2021 for several episodes of chest pain with concern for progressive angina given multivessel CAD on CT chest. He ultimately underwent cardiac catheterization on 11/01/2021, which revealed 75% mid RCA disease as well as nonobstructive CAD in the left coronary circulation with 25% distal left main stenosis and 40% proximal circumflex stenosis. He underwent successful PCI to RCA with excellent angiographic results. Was discharged on DAPT. TTE 11/2021 showed LVEF 60-65%, normal RV, no significant valve disease.   Was seen in follow-up by Eligha BridegroomMichelle Swinyer, NP where he had continued to have chest discomfort and palpitations. Cardiac monitor 11/2021 showed NSR, 4 runs of nonsustained SVT with longest lasting 14.4s, rare SVE and VE.  Today, he reports that he is doing well over all. Every once and a while he will have an episode of chest pain. He has taken 4 of the nitroglycerin since he was prescribed them and it immediatly relieves the pain. He does have some palpitations in the morning that he thinks he may be making worse with coffee. He is starting to work on switching to decaf.   He is still smoking a pack a day and does not plan on cessation.   His blood pressure at home has been averaging around 130-135 systolic. Occasionally he will have a reading of 140 or so. He has not been monitoring it as often since it has been averaging 130s.   He has his wife with him today and she reports that he does seem to get tired quickly since starting the medications.   He  denies any shortness of breath, or peripheral edema. No lightheadedness, headaches, syncope, orthopnea, or PND.    Past Medical History:  Diagnosis Date   Allergy    Anxiety    Hyperlipidemia    Hypertension     Past Surgical History:  Procedure Laterality Date   CORONARY STENT INTERVENTION N/A 11/01/2021   Procedure: CORONARY STENT INTERVENTION;  Surgeon: Lennette BihariKelly, Gradyn A, MD;  Location: [facility];  Service: Cardiovascular;  Laterality: N/A;   LEFT HEART CATH AND CORONARY ANGIOGRAPHY N/A 11/01/2021   Procedure: LEFT HEART CATH AND CORONARY ANGIOGRAPHY;  Surgeon: Lennette BihariKelly, Claudy A, MD;  Location: [facility];  Service: Cardiovascular;  Laterality: N/A;   TONSILLECTOMY      Current Medications: Current Meds  Medication Sig   aspirin EC 81 MG tablet Take 81 mg by mouth daily.   atorvastatin (LIPITOR) 80 MG tablet Take 1 tablet (80 mg total) by mouth daily.   clopidogrel (PLAVIX) 75 MG tablet Take 1 tablet (75 mg total) by mouth daily.   fluorouracil (EFUDEX) 5 % cream Apply 1(one) application topically 2(two) times a day for 2-4 weeks   furosemide (LASIX) 20 MG tablet Take 1 tablet (20 mg total) by mouth daily. AS NEEDED ONLY FOR SWELLING   LORazepam (ATIVAN) 0.5 MG tablet Take 1 tablet (0.5 mg total) by mouth 2 (two) times daily as needed for anxiety.   losartan (COZAAR) 100 MG tablet Take 1  tablet (100 mg total) by mouth daily.   Multiple Vitamin (MULTIVITAMIN WITH MINERALS) TABS tablet Take 1 tablet by mouth daily.   nitroGLYCERIN (NITROSTAT) 0.4 MG SL tablet Place 1 tablet (0.4 mg total) under the tongue every 5 (five) minutes as needed.   venlafaxine XR (EFFEXOR XR) 37.5 MG 24 hr capsule Take 1 capsule (37.5 mg total) by mouth daily with breakfast.   [DISCONTINUED] losartan (COZAAR) 50 MG tablet Take 1 tablet (50 mg total) by mouth daily.     Allergies:   Patient has no known allergies.   Social History   Socioeconomic History   Marital status: Married     Spouse name: Not on file   Number of children: Not on file   Years of education: Not on file   Highest education level: Not on file  Occupational History   Not on file  Tobacco Use   Smoking status: Every Day    Packs/day: 1.00    Types: Cigarettes   Smokeless tobacco: Never  Vaping Use   Vaping Use: Never used  Substance and Sexual Activity   Alcohol use: Yes   Drug use: Not Currently   Sexual activity: Yes  Other Topics Concern   Not on file  Social History Narrative   Not on file   Social Determinants of Health   Financial Resource Strain: Not on file  Food Insecurity: Not on file  Transportation Needs: Not on file  Physical Activity: Not on file  Stress: Not on file  Social Connections: Not on file     Family History: The patient's family history includes Arthritis in his father; Diabetes in his daughter and mother.  ROS:   Review of Systems  Constitutional:  Positive for malaise/fatigue. Negative for chills and fever.  HENT:  Negative for hearing loss and tinnitus.   Eyes:  Negative for blurred vision and double vision.  Respiratory:  Negative for cough and hemoptysis.   Cardiovascular:  Positive for chest pain (resolved with 1 dose nitroglycerin), palpitations (morning) and leg swelling (bilateral ankles). Negative for orthopnea, claudication and PND.  Gastrointestinal:  Negative for heartburn and nausea.  Genitourinary:  Negative for dysuria and urgency.  Musculoskeletal:  Negative for falls and neck pain.  Skin:  Negative for itching and rash.  Neurological:  Negative for dizziness and headaches.  Endo/Heme/Allergies:  Negative for environmental allergies. Does not bruise/bleed easily.  Psychiatric/Behavioral:  Negative for depression and suicidal ideas.      EKGs/Labs/Other Studies Reviewed:    The following studies were reviewed today: Cath 10/2021: CORONARY STENT INTERVENTION  LEFT HEART CATH AND CORONARY ANGIOGRAPHY   Conclusion      Prox RCA  lesion is 30% stenosed.   Mid RCA-1 lesion is 75% stenosed.   Mid RCA-2 lesion is 40% stenosed.   Prox Cx lesion is 40% stenosed.   Mid LM to Dist LM lesion is 25% stenosed.   A drug-eluting stent was successfully placed.   Post intervention, there is a 0% residual stenosis.   Post intervention, there is a 0% residual stenosis.   The left ventricular systolic function is normal.   LV end diastolic pressure is normal.   Significant CAD involving a large dominant RCA with 30% smooth proximal stenosis, and 75% eccentric stenosis in the RCA followed by 40% narrowing.   Mild nonobstructive CAD in the left coronary circulation with smooth 25% distal left main stenosis and 40% proximal circumflex stenosis.   Preserved global LV function with EF estimated at  55% but with small area of mid inferior hypocontractility.  LVEDP 9 mmHg.   Successful PCI to the mid RCA with insertion of a 3.0 x 20 mm Synergy stent postdilated to 3.5 mm with the stenosis improved to 0%.  It is notable that the patient had significant coronary vasodilation following IC nitroglycerin.   RECOMMENDATION: DAPT for minimum of 6 months but preferably 12 months.  Medical therapy for concomitant CAD.  Smoking cessation is essential.  Aggressive lipid-lowering therapy with target LDL less than 70.  Plan for discharge later today if he remains stable.  TTE 11/2021: IMPRESSIONS     1. Left ventricular ejection fraction, by estimation, is 60 to 65%. Left  ventricular ejection fraction by 3D volume is 65 %. The left ventricle has  normal function. The left ventricle has no regional wall motion  abnormalities. Left ventricular diastolic   parameters were normal. The average left ventricular global longitudinal  strain is -18.6 %. The global longitudinal strain is normal.   2. Right ventricular systolic function is normal. The right ventricular  size is normal.   3. The mitral valve is normal in structure. No evidence of mitral  valve  regurgitation. No evidence of mitral stenosis.   4. The aortic valve is tricuspid. Aortic valve regurgitation is not  visualized. Aortic valve sclerosis/calcification is present, without any  evidence of aortic stenosis.   5. The inferior vena cava is normal in size with greater than 50%  respiratory variability, suggesting right atrial pressure of 3 mmHg.    Cardiac Monitor 11/2021:    Patch wear time was 6 days and 22 hours   Predominant rhythm is NSR with average HR 74bpm   4 runs of nonsustained SVT with longest lasting 14.4s   Rare SVE (<1%), rare VE (<1%)   No sustained arrhythmias or significant pauses     Patch Wear Time:  6 days and 22 hours (2023-07-30T15:14:37-0400 to 2023-08-06T13:52:35-0400)   Patient had a min HR of 51 bpm, max HR of 143 bpm, and avg HR of 74 bpm. Predominant underlying rhythm was Sinus Rhythm. 4 Supraventricular Tachycardia runs occurred, the run with the fastest interval lasting 4 beats with a max rate of 143 bpm, the  longest lasting 14.4 secs with an avg rate of 93 bpm. Isolated SVEs were rare (<1.0%), SVE Couplets were rare (<1.0%), and SVE Triplets were rare (<1.0%). Isolated VEs were rare (<1.0%), VE Couplets were rare (<1.0%), and no VE Triplets were present.  Ventricular Bigeminy was present.   CT chest 05/2021: FINDINGS: Cardiovascular: Heart size is normal. There is no significant pericardial fluid, thickening or pericardial calcification. There is aortic atherosclerosis, as well as atherosclerosis of the great vessels of the mediastinum and the coronary arteries, including calcified atherosclerotic plaque in the left main, left anterior descending and right coronary arteries.   Mediastinum/Nodes: No pathologically enlarged mediastinal or hilar lymph nodes. Please note that accurate exclusion of hilar adenopathy is limited on noncontrast CT scans. Esophagus is unremarkable in appearance. No axillary lymphadenopathy.    Lungs/Pleura: No suspicious appearing pulmonary nodules or masses are noted. No acute consolidative airspace disease. No pleural effusions. Diffuse bronchial wall thickening with mild centrilobular and paraseptal emphysema   Upper Abdomen: Aortic atherosclerosis.   Musculoskeletal: Bilateral gynecomastia. There are no aggressive appearing lytic or blastic lesions noted in the visualized portions of the skeleton.   IMPRESSION: 1. Lung-RADS 1S, negative. Continue annual screening with low-dose chest CT without contrast in 12 months. 2. The "S"  modifier above refers to potentially clinically significant non lung cancer related findings. Specifically, there is aortic atherosclerosis, in addition to left main and 2 vessel coronary artery disease. Please note that although the presence of coronary artery calcium documents the presence of coronary artery disease, the severity of this disease and any potential stenosis cannot be assessed on this non-gated CT examination. Assessment for potential risk factor modification, dietary therapy or pharmacologic therapy may be warranted, if clinically indicated. 3. Mild diffuse bronchial wall thickening with mild centrilobular and paraseptal emphysema; imaging findings suggestive of underlying COPD.   Aortic Atherosclerosis (ICD10-I70.0) and Emphysema (ICD10-J43.9).  EKG: EKG is personally reviewed.  02/21/2022:No new ECG today 10/17/21  sinus bradycardia. No acute ischemic changes  Recent Labs: 01/29/2022: ALT 17; BUN 15; Creatinine, Ser 0.87; Hemoglobin 14.5; Platelets 303.0; Potassium 4.8; Sodium 132; TSH 3.42  Recent Lipid Panel    Component Value Date/Time   CHOL 121 12/12/2021 1048   TRIG 198 (H) 12/12/2021 1048   HDL 43 12/12/2021 1048   CHOLHDL 2.8 12/12/2021 1048   CHOLHDL 3 09/18/2021 1654   VLDL 54.4 (H) 09/18/2021 1654   LDLCALC 46 12/12/2021 1048   LDLDIRECT 60.0 09/18/2021 1654     Risk Assessment/Calculations:            Physical Exam:    VS:  BP 132/80   Pulse (!) 56   Ht 5\' 9"  (1.753 m)   Wt 167 lb (75.8 kg)   SpO2 95%   BMI 24.66 kg/m     Wt Readings from Last 3 Encounters:  02/21/22 167 lb (75.8 kg)  01/29/22 168 lb (76.2 kg)  11/20/21 162 lb 9.6 oz (73.8 kg)     GEN:  Well nourished, well developed in no acute distress HEENT: Normal NECK: No JVD; No carotid bruits CARDIAC: RRR,  1/6 systolic murmur. No rubs or gallops RESPIRATORY:  Clear to auscultation without rales, wheezing or rhonchi  ABDOMEN: Soft, non-tender, non-distended MUSCULOSKELETAL:  Trace-1+ bilateral LE edema; No deformity  NEUROLOGIC:  Alert and oriented x 3 PSYCHIATRIC:  Normal affect   ASSESSMENT:    1. Coronary artery disease of native artery of native heart with stable angina pectoris (HCC)   2. Essential hypertension   3. Primary hypertension   4. Medication management   5. Tobacco abuse   6. Palpitations   7. Hyperlipidemia LDL goal <70   8. Status post coronary artery stent placement     PLAN:    In order of problems listed above:  #CAD: Cath with 75% mid-RCA s/p PCI with mild nonobstructive disease elsewhere. Currently, doing well with occasional chest discomfort that is relieved by NTG.  -Continue ASA 81mg  daily -Continue plavix 75mg  daily -Continue lipitor 80mg  daily -Continue bystolic 10mg  daily -Continue losartan 50mg  daily  #Palpitations: Zio with nonsustained runs of SVT lasting up to 14s but overall rare ectopy and no Afib. Currently, symptoms are improved. Working on cutting back on caffeine. -Continue bystolic 10mg  daily  #HTN: Elevated to 130-140s at home. -Increase losartan to 100mg  daily -Continue bystolic 10mg  daily -Check BMET in 2 weeks for monitoring  #HLD: -Continue lipitor 80mg  daily -LDL well controlled at 46  #Tobacco Abuse: -Tobacco cessation encouraged       Follow Up: 6 months Medication Adjustments/Labs and Tests Ordered: Current medicines are  reviewed at length with the patient today.  Concerns regarding medicines are outlined above.  Orders Placed This Encounter  Procedures   Basic Metabolic Panel (BMET)   Meds ordered  this encounter  Medications   furosemide (LASIX) 20 MG tablet    Sig: Take 1 tablet (20 mg total) by mouth daily. AS NEEDED ONLY FOR SWELLING    Dispense:  90 tablet    Refill:  3   losartan (COZAAR) 100 MG tablet    Sig: Take 1 tablet (100 mg total) by mouth daily.    Dispense:  90 tablet    Refill:  3    DOSE CHANGE    Patient Instructions  Medication Instructions:  Start lasix 20 mg daily as needed for swelling  Increase Losartan to 100 mg a day   *If you need a refill on your cardiac medications before your next appointment, please call your pharmacy*   Lab Work: BMET IN [place] IN 2 WEEKS   If you have labs (blood work) drawn today and your tests are completely normal, you will receive your results only by: MyChart Message (if you have MyChart) OR A paper copy in the mail If you have any lab test that is abnormal or we need to change your treatment, we will call you to review the results.   Testing/Procedures:    Follow-Up: At [facility], you and your health needs are our priority.  As part of our continuing mission to provide you with exceptional heart care, we have created designated Provider Care Teams.  These Care Teams include your primary Cardiologist (physician) and Advanced Practice Providers (APPs -  Physician Assistants and Nurse Practitioners) who all work together to provide you with the care you need, when you need it.  We recommend signing up for the patient portal called "MyChart".  Sign up information is provided on this After Visit Summary.  MyChart is used to connect with patients for Virtual Visits (Telemedicine).  Patients are able to view lab/test results, encounter notes, upcoming appointments, etc.  Non-urgent messages can be sent to your provider as well.    To learn more about what you can do with MyChart, go to [email or web address].    Your next appointment:   6 month(s)  The format for your next appointment:   In Person  Provider:   Meriam Sprague, MD     Other Instructions   Important Information About Sugar          Jodelle Gross Ford,acting as a scribe for Meriam Sprague, MD.,have documented all relevant documentation on the behalf of Meriam Sprague, MD,as directed by  Meriam Sprague, MD while in the presence of Meriam Sprague, MD.   I, Meriam Sprague, MD, have reviewed all documentation for this visit. The documentation on 02/21/22 for the exam, diagnosis, procedures, and orders are all accurate and complete.   Signed, Meriam Sprague, MD  02/21/2022 9:57 AM    [facility]

## 2022-02-21 NOTE — Patient Instructions (Signed)
Medication Instructions:  Start lasix 20 mg daily as needed for swelling  Increase Losartan to 100 mg a day   *If you need a refill on your cardiac medications before your next appointment, please call your pharmacy*   Lab Work: BMET IN [place] IN 2 WEEKS   If you have labs (blood work) drawn today and your tests are completely normal, you will receive your results only by: MyChart Message (if you have MyChart) OR A paper copy in the mail If you have any lab test that is abnormal or we need to change your treatment, we will call you to review the results.   Testing/Procedures:    Follow-Up: At [facility], you and your health needs are our priority.  As part of our continuing mission to provide you with exceptional heart care, we have created designated Provider Care Teams.  These Care Teams include your primary Cardiologist (physician) and Advanced Practice Providers (APPs -  Physician Assistants and Nurse Practitioners) who all work together to provide you with the care you need, when you need it.  We recommend signing up for the patient portal called "MyChart".  Sign up information is provided on this After Visit Summary.  MyChart is used to connect with patients for Virtual Visits (Telemedicine).  Patients are able to view lab/test results, encounter notes, upcoming appointments, etc.  Non-urgent messages can be sent to your provider as well.   To learn more about what you can do with MyChart, go to [email or web address].    Your next appointment:   6 month(s)  The format for your next appointment:   In Person  Provider:   Meriam Sprague, MD     Other Instructions   Important Information About Sugar

## 2022-02-22 ENCOUNTER — Other Ambulatory Visit (HOSPITAL_BASED_OUTPATIENT_CLINIC_OR_DEPARTMENT_OTHER): Payer: Self-pay

## 2022-03-01 ENCOUNTER — Other Ambulatory Visit (HOSPITAL_BASED_OUTPATIENT_CLINIC_OR_DEPARTMENT_OTHER): Payer: Self-pay

## 2022-03-03 DIAGNOSIS — E785 Hyperlipidemia, unspecified: Secondary | ICD-10-CM | POA: Diagnosis not present

## 2022-03-03 DIAGNOSIS — Z8249 Family history of ischemic heart disease and other diseases of the circulatory system: Secondary | ICD-10-CM | POA: Diagnosis not present

## 2022-03-03 DIAGNOSIS — R69 Illness, unspecified: Secondary | ICD-10-CM | POA: Diagnosis not present

## 2022-03-03 DIAGNOSIS — Z720 Tobacco use: Secondary | ICD-10-CM | POA: Diagnosis not present

## 2022-03-03 DIAGNOSIS — I252 Old myocardial infarction: Secondary | ICD-10-CM | POA: Diagnosis not present

## 2022-03-03 DIAGNOSIS — Z85828 Personal history of other malignant neoplasm of skin: Secondary | ICD-10-CM | POA: Diagnosis not present

## 2022-03-03 DIAGNOSIS — I2510 Atherosclerotic heart disease of native coronary artery without angina pectoris: Secondary | ICD-10-CM | POA: Diagnosis not present

## 2022-03-03 DIAGNOSIS — Z7902 Long term (current) use of antithrombotics/antiplatelets: Secondary | ICD-10-CM | POA: Diagnosis not present

## 2022-03-03 DIAGNOSIS — Z833 Family history of diabetes mellitus: Secondary | ICD-10-CM | POA: Diagnosis not present

## 2022-03-03 DIAGNOSIS — R609 Edema, unspecified: Secondary | ICD-10-CM | POA: Diagnosis not present

## 2022-03-03 DIAGNOSIS — Z955 Presence of coronary angioplasty implant and graft: Secondary | ICD-10-CM | POA: Diagnosis not present

## 2022-03-03 DIAGNOSIS — I10 Essential (primary) hypertension: Secondary | ICD-10-CM | POA: Diagnosis not present

## 2022-03-05 ENCOUNTER — Other Ambulatory Visit (HOSPITAL_BASED_OUTPATIENT_CLINIC_OR_DEPARTMENT_OTHER): Payer: Self-pay

## 2022-03-06 DIAGNOSIS — H903 Sensorineural hearing loss, bilateral: Secondary | ICD-10-CM | POA: Diagnosis not present

## 2022-03-06 DIAGNOSIS — H9313 Tinnitus, bilateral: Secondary | ICD-10-CM | POA: Diagnosis not present

## 2022-04-03 ENCOUNTER — Ambulatory Visit (INDEPENDENT_AMBULATORY_CARE_PROVIDER_SITE_OTHER): Payer: 59 | Admitting: Medical

## 2022-04-03 ENCOUNTER — Other Ambulatory Visit (HOSPITAL_BASED_OUTPATIENT_CLINIC_OR_DEPARTMENT_OTHER): Payer: Self-pay

## 2022-04-03 ENCOUNTER — Encounter: Payer: Self-pay | Admitting: Medical

## 2022-04-03 VITALS — BP 150/89 | HR 75 | Temp 98.0°F | Resp 18 | Ht 69.0 in | Wt 164.2 lb

## 2022-04-03 DIAGNOSIS — G4700 Insomnia, unspecified: Secondary | ICD-10-CM | POA: Diagnosis not present

## 2022-04-03 DIAGNOSIS — F3289 Other specified depressive episodes: Secondary | ICD-10-CM | POA: Diagnosis not present

## 2022-04-03 DIAGNOSIS — I10 Essential (primary) hypertension: Secondary | ICD-10-CM

## 2022-04-03 DIAGNOSIS — R69 Illness, unspecified: Secondary | ICD-10-CM | POA: Diagnosis not present

## 2022-04-03 MED ORDER — TRAZODONE HCL 50 MG PO TABS
25.0000 mg | ORAL_TABLET | Freq: Every evening | ORAL | 0 refills | Status: DC | PRN
  Filled 2022-04-03: qty 30, 30d supply, fill #0

## 2022-04-03 MED ORDER — TRAZODONE HCL 50 MG PO TABS: 25.0000 mg | ORAL_TABLET | Freq: Every evening | ORAL | 0 refills | Status: DC | PRN

## 2022-04-03 MED ORDER — HYDROCHLOROTHIAZIDE 12.5 MG PO CAPS
12.5000 mg | ORAL_CAPSULE | Freq: Every day | ORAL | 3 refills | Status: DC
  Filled 2022-04-03: qty 30, 30d supply, fill #0
  Filled 2022-05-07: qty 30, 30d supply, fill #1

## 2022-04-03 NOTE — Patient Instructions (Addendum)
Hypertension-blood pressure recently elevated when you check intermittently in the morning.  Also blood pressure elevated today.  Recommend continuing losartan 100 mg daily and Bystolic 10 mg daily.  Will add on a HCTZ 12.5 mg daily in the morning.  Depression and anxiety.  Anxiety recently increased in the morning but overall seem more depressed.  Also some insomnia noted as well.  Continue Effexor 37.5 mg daily.  Prescribe trazodone to use 25-50 mg nightly as needed insomnia.  He might get some improved mood as well.  I did give you a sheet to call around for counselors.  I would asked them specifically if they do couples counseling based on your circumstances/history.  Let me know who you called and are scheduled to see.  I can place referral if needed.  Follow-up in 10 days.  Will see how you are doing with trazodone and recheck your blood pressure.

## 2022-04-03 NOTE — Progress Notes (Signed)
Subjective:    Patient ID: Eric Gillespie, male    DOB: Nov 09, 1955, 66 y.o.   MRN: [id number]  HPI Pt in for follow up.  Pt wife states he is extremely sad recently per wife. Pt wife states he is sad about contemplating on prior struggles in their relationship. They don't go into details. Usually does occur this time of the year/holiday.  Pt is frustrated today. He refuse to answer phq-9 and gad-7. Wife wants them to go to counseling. They did go to counseling together in mid 90's.   Pt has been on venlafaxine in the past. Pt  increased his effexor to 75 mg daily for 2 weeks. It did not help much so they decreased to 37.5 mg again. In morning he gets more anxious.   Some difficulty sleeping. Only sleeping 4-6 hours   Htn- bp is high initialy today. Pt is on losartan 100 mg daily and bystolic 10 mg daily.  Pt was given lasix for occasional pedal edema. He has not opened the bottle yet. When he checks bp daily in  am higher. But he is more anxious in morning.    Review of Systems  Constitutional:  Negative for chills and fatigue.  Respiratory:  Negative for chest tightness, shortness of breath and wheezing.   Cardiovascular:  Negative for chest pain and palpitations.  Gastrointestinal:  Negative for abdominal pain.  Musculoskeletal:  Negative for back pain.  Neurological:  Negative for dizziness and headaches.  Hematological:  Negative for adenopathy. Does not bruise/bleed easily.  Psychiatric/Behavioral:  Negative for behavioral problems, decreased concentration and dysphoric mood.     Past Medical History:  Diagnosis Date   Allergy    Anxiety    Hyperlipidemia    Hypertension      Social History   Socioeconomic History   Marital status: Married    Spouse name: Not on file   Number of children: Not on file   Years of education: Not on file   Highest education level: Not on file  Occupational History   Not on file  Tobacco Use   Smoking status: Every Day     Packs/day: 1.00    Types: Cigarettes   Smokeless tobacco: Never  Vaping Use   Vaping Use: Never used  Substance and Sexual Activity   Alcohol use: Yes   Drug use: Not Currently   Sexual activity: Yes  Other Topics Concern   Not on file  Social History Narrative   Not on file   Social Determinants of Health   Financial Resource Strain: Not on file  Food Insecurity: Not on file  Transportation Needs: Not on file  Physical Activity: Not on file  Stress: Not on file  Social Connections: Not on file  Intimate Partner Violence: Not on file    Past Surgical History:  Procedure Laterality Date   CORONARY STENT INTERVENTION N/A 11/01/2021   Procedure: CORONARY STENT INTERVENTION;  Surgeon: Troy Sine, MD;  Location: [facility];  Service: Cardiovascular;  Laterality: N/A;   LEFT HEART CATH AND CORONARY ANGIOGRAPHY N/A 11/01/2021   Procedure: LEFT HEART CATH AND CORONARY ANGIOGRAPHY;  Surgeon: Troy Sine, MD;  Location: [facility];  Service: Cardiovascular;  Laterality: N/A;   TONSILLECTOMY      Family History  Problem Relation Age of Onset   Diabetes Mother    Arthritis Father    Diabetes Daughter     No Known Allergies  Current Outpatient Medications on  File Prior to Visit  Medication Sig Dispense Refill   aspirin EC 81 MG tablet Take 81 mg by mouth daily.     atorvastatin (LIPITOR) 80 MG tablet Take 1 tablet (80 mg total) by mouth daily. 90 tablet 1   clopidogrel (PLAVIX) 75 MG tablet Take 1 tablet (75 mg total) by mouth daily. 90 tablet 4   fluorouracil (EFUDEX) 5 % cream Apply 1(one) application topically 2(two) times a day for 2-4 weeks 40 g 0   furosemide (LASIX) 20 MG tablet Take 1 tablet (20 mg total) by mouth daily. AS NEEDED ONLY FOR SWELLING 90 tablet 3   LORazepam (ATIVAN) 0.5 MG tablet Take 1 tablet (0.5 mg total) by mouth 2 (two) times daily as needed for anxiety. 30 tablet 3   losartan (COZAAR) 100 MG tablet Take 1 tablet (100 mg total)  by mouth daily. 90 tablet 3   Multiple Vitamin (MULTIVITAMIN WITH MINERALS) TABS tablet Take 1 tablet by mouth daily.     nitroGLYCERIN (NITROSTAT) 0.4 MG SL tablet Place 1 tablet (0.4 mg total) under the tongue every 5 (five) minutes as needed. 25 tablet 2   venlafaxine XR (EFFEXOR XR) 37.5 MG 24 hr capsule Take 1 capsule (37.5 mg total) by mouth daily with breakfast. 90 capsule 3   nebivolol (BYSTOLIC) 10 MG tablet Take 1 tablet (10 mg total) by mouth daily for 14 days. 14 tablet 0   No current facility-administered medications on file prior to visit.    BP (!) 150/89   Pulse 75   Temp 98 F (36.7 C)   Resp 18   Ht 5\' 9"  (1.753 m)   Wt 164 lb 3.2 oz (74.5 kg)   SpO2 94%   BMI 24.25 kg/m        Objective:   Physical Exam   General Mental Status- Alert. General Appearance- Not in acute distress.   Skin General: Color- Normal Color. Moisture- Normal Moisture.  Neck Carotid Arteries- Normal color. Moisture- Normal Moisture. No carotid bruits. No JVD.  Chest and Lung Exam Auscultation: Breath Sounds:-Normal.  Cardiovascular Auscultation:Rythm- Regular. Murmurs & Other Heart Sounds:Auscultation of the heart reveals- No Murmurs.  Abdomen Inspection:-Inspeection Normal. Palpation/Percussion:Note:No mass. Palpation and Percussion of the abdomen reveal- Non Tender, Non Distended + BS, no rebound or guarding.    Neurologic Cranial Nerve exam:- CN III-XII intact(No nystagmus), symmetric smile. Drift Test:- No drift. Romberg Exam:- Negative.  Heal to Toe Gait exam:-Normal. Finger to Nose:- Normal/Intact Strength:- 5/5 equal and symmetric strength both upper and lower extremities.      Assessment & Plan:   Patient Instructions  Hypertension-blood pressure recently elevated when you check intermittently in the morning.  Also blood pressure elevated today.  Recommend continuing losartan 100 mg daily and Bystolic 10 mg daily.  Will add on a HCTZ 12.5 mg daily in the  morning.  Depression and anxiety.  Anxiety recently increased in the morning but overall seem more depressed.  Also some insomnia noted as well.  Continue Effexor 37.5 mg daily.  Prescribe trazodone to use 25-50 mg nightly as needed insomnia.  He might get some improved mood as well.  I did give you a sheet to call around for counselors.  I would asked them specifically if they do couples counseling based on your circumstances/history.  Let me know who you called and are scheduled to see.  I can place referral if needed.  Follow-up in 10 days.  Will see how you are doing with trazodone and  recheck your blood pressure.   Mackie Pai, PA-C

## 2022-05-07 ENCOUNTER — Other Ambulatory Visit (HOSPITAL_BASED_OUTPATIENT_CLINIC_OR_DEPARTMENT_OTHER): Payer: Self-pay

## 2022-05-08 ENCOUNTER — Other Ambulatory Visit (HOSPITAL_BASED_OUTPATIENT_CLINIC_OR_DEPARTMENT_OTHER): Payer: Self-pay

## 2022-05-09 DIAGNOSIS — R251 Tremor, unspecified: Secondary | ICD-10-CM | POA: Diagnosis not present

## 2022-05-09 DIAGNOSIS — E871 Hypo-osmolality and hyponatremia: Secondary | ICD-10-CM | POA: Diagnosis not present

## 2022-05-09 DIAGNOSIS — R531 Weakness: Secondary | ICD-10-CM | POA: Diagnosis not present

## 2022-05-09 DIAGNOSIS — R918 Other nonspecific abnormal finding of lung field: Secondary | ICD-10-CM | POA: Diagnosis not present

## 2022-05-09 DIAGNOSIS — F1090 Alcohol use, unspecified, uncomplicated: Secondary | ICD-10-CM | POA: Diagnosis not present

## 2022-05-09 DIAGNOSIS — R059 Cough, unspecified: Secondary | ICD-10-CM | POA: Diagnosis not present

## 2022-05-10 ENCOUNTER — Ambulatory Visit: Payer: 59 | Admitting: Medical

## 2022-05-21 ENCOUNTER — Ambulatory Visit (INDEPENDENT_AMBULATORY_CARE_PROVIDER_SITE_OTHER): Payer: Medicaid Other | Admitting: Medical

## 2022-05-21 ENCOUNTER — Other Ambulatory Visit (HOSPITAL_BASED_OUTPATIENT_CLINIC_OR_DEPARTMENT_OTHER): Payer: Self-pay

## 2022-05-21 VITALS — BP 144/86 | HR 100 | Temp 98.0°F | Resp 18 | Ht 69.0 in | Wt 164.0 lb

## 2022-05-21 DIAGNOSIS — I10 Essential (primary) hypertension: Secondary | ICD-10-CM | POA: Diagnosis not present

## 2022-05-21 DIAGNOSIS — R251 Tremor, unspecified: Secondary | ICD-10-CM

## 2022-05-21 DIAGNOSIS — I252 Old myocardial infarction: Secondary | ICD-10-CM | POA: Diagnosis not present

## 2022-05-21 DIAGNOSIS — R55 Syncope and collapse: Secondary | ICD-10-CM

## 2022-05-21 DIAGNOSIS — R079 Chest pain, unspecified: Secondary | ICD-10-CM | POA: Diagnosis not present

## 2022-05-21 DIAGNOSIS — R5383 Other fatigue: Secondary | ICD-10-CM | POA: Diagnosis not present

## 2022-05-21 LAB — CBC WITH DIFFERENTIAL/PLATELET
Basophils Absolute: 0.1 10*3/uL (ref 0.0–0.1)
Basophils Relative: 0.8 % (ref 0.0–3.0)
Eosinophils Absolute: 0 10*3/uL (ref 0.0–0.7)
Eosinophils Relative: 0.5 % (ref 0.0–5.0)
HCT: 40.2 % (ref 39.0–52.0)
Hemoglobin: 13.9 g/dL (ref 13.0–17.0)
Lymphocytes Relative: 14.3 % (ref 12.0–46.0)
Lymphs Abs: 1.4 10*3/uL (ref 0.7–4.0)
MCHC: 34.5 g/dL (ref 30.0–36.0)
MCV: 100.6 fl — ABNORMAL HIGH (ref 78.0–100.0)
Monocytes Absolute: 0.6 10*3/uL (ref 0.1–1.0)
Monocytes Relative: 5.8 % (ref 3.0–12.0)
Neutro Abs: 7.8 10*3/uL — ABNORMAL HIGH (ref 1.4–7.7)
Neutrophils Relative %: 78.6 % — ABNORMAL HIGH (ref 43.0–77.0)
Platelets: 319 10*3/uL (ref 150.0–400.0)
RBC: 4 Mil/uL — ABNORMAL LOW (ref 4.22–5.81)
RDW: 13.2 % (ref 11.5–15.5)
WBC: 9.9 10*3/uL (ref 4.0–10.5)

## 2022-05-21 LAB — COMPREHENSIVE METABOLIC PANEL
ALT: 12 U/L (ref 0–53)
AST: 25 U/L (ref 0–37)
Albumin: 3.8 g/dL (ref 3.5–5.2)
Alkaline Phosphatase: 92 U/L (ref 39–117)
BUN: 7 mg/dL (ref 6–23)
CO2: 26 mEq/L (ref 19–32)
Calcium: 9 mg/dL (ref 8.4–10.5)
Chloride: 99 mEq/L (ref 96–112)
Creatinine, Ser: 0.84 mg/dL (ref 0.40–1.50)
GFR: 92.39 mL/min (ref >60.00)
Glucose, Bld: 92 mg/dL (ref 70–99)
Potassium: 4.7 mEq/L (ref 3.5–5.1)
Sodium: 132 mEq/L — ABNORMAL LOW (ref 135–145)
Total Bilirubin: 0.4 mg/dL (ref 0.2–1.2)
Total Protein: 6.7 g/dL (ref 6.0–8.3)

## 2022-05-21 LAB — T4, FREE: Free T4: 0.64 ng/dL (ref 0.60–1.60)

## 2022-05-21 LAB — VITAMIN B12: Vitamin B-12: 382 pg/mL (ref 211–911)

## 2022-05-21 LAB — TSH: TSH: 1.76 u[IU]/mL (ref 0.35–5.50)

## 2022-05-21 MED ORDER — VENLAFAXINE HCL ER 37.5 MG PO CP24
37.5000 mg | ORAL_CAPSULE | Freq: Every day | ORAL | 3 refills | Status: DC
  Filled 2022-05-21: qty 90, 90d supply, fill #0

## 2022-05-21 MED ORDER — ATORVASTATIN CALCIUM 80 MG PO TABS
80.0000 mg | ORAL_TABLET | Freq: Every day | ORAL | 1 refills | Status: DC
  Filled 2022-05-21: qty 90, 90d supply, fill #0
  Filled 2022-08-08: qty 90, 90d supply, fill #1

## 2022-05-21 MED ORDER — LOSARTAN POTASSIUM 100 MG PO TABS
100.0000 mg | ORAL_TABLET | Freq: Every day | ORAL | 3 refills | Status: DC
  Filled 2022-05-21: qty 90, 90d supply, fill #0
  Filled 2022-08-08: qty 90, 90d supply, fill #1

## 2022-05-21 MED ORDER — NITROGLYCERIN 0.4 MG SL SUBL
0.4000 mg | SUBLINGUAL_TABLET | SUBLINGUAL | 2 refills | Status: DC | PRN
  Filled 2022-05-21: qty 25, 9d supply, fill #0

## 2022-05-21 MED ORDER — TRAZODONE HCL 50 MG PO TABS
25.0000 mg | ORAL_TABLET | Freq: Every evening | ORAL | 11 refills | Status: DC | PRN
  Filled 2022-05-21: qty 30, 30d supply, fill #0

## 2022-05-21 MED ORDER — CLOPIDOGREL BISULFATE 75 MG PO TABS
75.0000 mg | ORAL_TABLET | Freq: Every day | ORAL | 4 refills | Status: DC
  Filled 2022-05-21: qty 90, 90d supply, fill #0
  Filled 2022-08-08: qty 90, 90d supply, fill #1

## 2022-05-21 NOTE — Progress Notes (Signed)
Subjective:    Patient ID: Eric Gillespie, male    DOB: Oct 19, 1955, 67 y.o.   MRN: [id number]  HPI  Pt bp today in am was 136/79. Pulse 75.    Pt has some recent tremors. Seen in the ED.  "67 year old male with past medical history of hypertension, hyperlipidemia, anxiety, chronic tobacco use, daily alcohol drinker presents to the emergency department for evaluation of shakiness, shortness of breath, increased cough for the past 5 days. Patient reports his symptoms worsen towards the end of the day. Patient has resorted to drinking alcohol to calm his symptoms. Patient reports that alcohol resolves his symptoms. Patient reports that he has never had alcohol withdrawal in the past. He only can go 3 or 4 days without drinking and does not develop any symptoms. Patient denies fevers, chills, nausea, vomiting, abdominal pain, diarrhea, constipation, chest pain, palpitations."   Pt had work up that was negative.  Medical Decision Making Given patient's history and physical, the following studies were ordered. Orders Placed This Encounter COVID-19, Flu A+B and RSV Nasopharyngeal Swab Nasopharyngeal Swab XR Chest Pa And Lateral CBC And Differential Comprehensive metabolic panel [place] / Extra tubes Urinalysis W/Micro Reflex Culture - Symptomatic Enhanced Respiratory isolation status ECG 12 lead  Differential diagnosis includes but is not limited to pneumonia, electrolyte abnormality, CVA, UTI, drug intoxication, alcohol withdrawal, neurological disorder.  Results were reviewed, CBC significant for slightly elevated white count of 11.6, CMP significant for hyponatremia of 130, normal urinalysis, negative UDS, no acute findings on chest x-ray, no acute findings on CT of head.  In the ED patient was administered IV fluids.  Results were discussed with the patient who demonstrated understanding. Although patient understands that the fact that his symptoms improve with ingestion of alcohol  which is consistent with alcohol withdrawal, he assures he has never developed the symptoms when he has stopped drinking for a few days at a time. In the ED patient had he is coarse tremors in his hands while standing and ambulating. Tremors would disappear when he was laying down even if he was reaching for something. Patient otherwise neurologically intact.  Patient was laying comfortably in ED bed, in no acute distress, non toxic appearing, speaking in full sentences.  Patient was offered resources in the ED for alcohol cessation through a behavioral health consult. Patient declined at this time.  Patient's presentation, evaluation, and plan was discussed with ED attending who agreed with management. Discussed need for outpatient neurology follow-up and starting folic acid supplement daily.  Results of patient's evaluation are reassuring and no other studies and interventions are deemed necessary in the ED setting at this time. Patient was discharged home in stable conditions with recommendations for close outpatient follow up."   Pt states he in past drank 1-2 drink tumbler of vodka 2 days out of week  for years. Pt states he can stop drinking for 2-3 days in past and never got shakes/tremors.  He states has gradually gotten less tremors since ED visit. Now occurs more late in day only when active.  About 4 weeks ago is when he was shaking in upper and lower extremities. He states this occurred at same time he found out wife was unfaithful years ago. Now he stating will divorce her. Pt declines to fill out gad-7 and phq-9. Denies thoughts of harm to self or others. He states feels anxious and frustrated  since he found out the above.   No family history of movement disorders.  Pt also states he randomly passed out in his kitchen walking  15-20 feet. He had chest pain took a nitro about 3-5 minute before he passed out. His wife came to his aids quickly and he came to.    Pt states had  chest pain day before yesterday for 5 minutes. Resolved with nitro and rest.  Pt has anxiety- still on ativan. Pt states he is tyring to ween off med. He is only on one tab daily presently. Pt still on effexor.      Review of Systems  Constitutional:  Positive for fatigue. Negative for chills and fever.  Respiratory:  Negative for cough, chest tightness, shortness of breath and wheezing.   Cardiovascular:  Negative for chest pain and palpitations.  Genitourinary:  Negative for dysuria, flank pain and frequency.  Musculoskeletal:  Negative for back pain.  Skin:  Negative for rash.  Neurological:  Negative for dizziness, syncope, weakness, numbness and headaches.       Tremor.  Hematological:  Negative for adenopathy.  Psychiatric/Behavioral:  Negative for behavioral problems and suicidal ideas. The patient is not nervous/anxious.            Objective:   Physical Exam   General Mental Status- Alert. General Appearance- Not in acute distress.   Skin General: Color- Normal Color. Moisture- Normal Moisture.  Neck Carotid Arteries- Normal color. Moisture- Normal Moisture. No carotid bruits. No JVD.  Chest and Lung Exam Auscultation: Breath Sounds:-Normal.  Cardiovascular Auscultation:Rythm- Regular. Murmurs & Other Heart Sounds:Auscultation of the heart reveals- No Murmurs.  Abdomen Inspection:-Inspeection Normal. Palpation/Percussion:Note:No mass. Palpation and Percussion of the abdomen reveal- Non Tender, Non Distended + BS, no rebound or guarding.    Neurologic Cranial Nerve exam:- CN III-XII intact(No nystagmus), symmetric smile. Drift Test:- No drift. Romberg Exam:- Negative.  Heal to Toe Gait exam:-Normal. Finger to Nose:- Normal/Intact Strength:- 5/5 equal and symmetric strength both upper and lower extremities.      Assessment & Plan:   Patient Instructions  Tremor and shakes. Will place referral to neurologist thru [facility] affiliated neurologist. If the  ED got you in already with neurologist recommend take as soon as possible.  For recent chest pain and syncope event we did EKG. EKG showed sinus rhythm. EKG looks similar to prior EKG but v1  st change. 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 No current chest pain or associated cardiac symptoms today. Will try to get you back I with your cardiologist. If you have recurrent chest pain and have to take nitro. Sit or lie down. Be cautious as bp drop can occcur. You may have had low bp event when passed out in kitchen.  Anxiety and stress related to relationship issues with wife. Recommend conitnue effexor and ativan.  For fatigue will get cbc, cmp, tsh, t4, cmp and cbc.  Recommend limit alcohol use to twice a week and limited amount.  Follow up 2 weeks or sooner if needed.    Time spent with patient today was  45 minutes which consisted of chart review, discussing diagnosis, work up, treatment and documentation.

## 2022-05-21 NOTE — Patient Instructions (Addendum)
Tremor and shakes. Will place referral to neurologist thru [facility] affiliated neurologist. If the ED got you in already with other  neurologist recommend take as soon as possible. I am not convinced completely that it is related to alcohol withdrawal. Though is possible.  For recent chest pain and syncope event we did EKG. EKG showed sinus rhythm. EKG looks similar to prior EKG but v1 st change. 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 No current chest pain or associated cardiac symptoms today. Will try to get you back I with your cardiologist. If you have recurrent chest pain and have to take nitro. Sit or lie down. Be cautious as bp drop can occcur. You may have had low bp event when passed out in kitchen.  Anxiety and stress related to relationship issues with wife. Recommend conitnue effexor and ativan.  For fatigue will get cbc, cmp, tsh, t4, cmp and cbc.  Recommend limit alcohol use to twice a week and limited amount.  Follow up 2 weeks or sooner if needed.

## 2022-05-25 LAB — VITAMIN B1: Vitamin B1 (Thiamine): <6 nmol/L — ABNORMAL LOW (ref 8–30)

## 2022-05-27 ENCOUNTER — Telehealth: Payer: Self-pay | Admitting: Medical

## 2022-05-27 NOTE — Telephone Encounter (Signed)
Pt called stating that [facility] told him that he would benefit more from a psych eval to better control his anxiety rather than going through neuro. Pt stated that he doesn't feel this is right and wants to know what the next steps would be. Advised a note would be sent back to review referral notes from [facility] and to advise on how to proceed. Pt acknowledged understanding.

## 2022-05-28 NOTE — Addendum Note (Signed)
Addended by: Anabel Halon on: 05/28/2022 04:18 PM   Modules accepted: Orders

## 2022-06-05 ENCOUNTER — Ambulatory Visit (INDEPENDENT_AMBULATORY_CARE_PROVIDER_SITE_OTHER): Payer: Medicaid Other | Admitting: Medical

## 2022-06-05 VITALS — BP 140/84 | HR 84 | Temp 98.5°F | Resp 18 | Ht 69.0 in | Wt 158.8 lb

## 2022-06-05 DIAGNOSIS — R251 Tremor, unspecified: Secondary | ICD-10-CM

## 2022-06-05 DIAGNOSIS — F419 Anxiety disorder, unspecified: Secondary | ICD-10-CM | POA: Diagnosis not present

## 2022-06-05 DIAGNOSIS — I10 Essential (primary) hypertension: Secondary | ICD-10-CM | POA: Diagnosis not present

## 2022-06-05 NOTE — Progress Notes (Signed)
Subjective:    Patient ID: Eric Gillespie, male    DOB: 08/14/1955, 67 y.o.   MRN: [id number]  HPI Pt in for follow on tremors.   Pt states he has not drank any alcohol since 05-27-2022.   Pt states his tremors are still occurring both upper and lower extremity. States feels at times feels like cold chills. No gait disturbance. The more active he is the worse tremors are. 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   I had referred to [facility]  neurologist initially(local office had declined due to concerns alcohol withdrawal) and then decided on [facility] neurologist as [facility] ED initially stated they would refer after his initial presentation.  Pt has some anxiety but ironically recently some better since his wife moved out/went to [place]. Duration not known?   Htn- pt states has been checking his bp at home and systolic has ranged from 0000000.  Pt is on losartan and bystolic. Pt stopped hctz as he initially thought tremors was from hctz.     Review of Systems  Constitutional:  Negative for chills, fatigue and fever.  Respiratory:  Negative for cough, chest tightness, shortness of breath and wheezing.   Cardiovascular:  Negative for chest pain and palpitations.  Gastrointestinal:  Negative for abdominal pain, blood in stool and diarrhea.  Genitourinary:  Negative for flank pain, frequency, hematuria and penile pain.  Musculoskeletal:  Negative for back pain, joint swelling, myalgias and neck stiffness.  Neurological:  Negative for dizziness, seizures, speech difficulty, weakness and light-headedness.  Hematological:  Negative for adenopathy. Does not bruise/bleed easily.  Psychiatric/Behavioral:  Negative for agitation, decreased concentration, dysphoric mood and hallucinations.     Past Medical History:  Diagnosis Date   Allergy    Anxiety    Hyperlipidemia    Hypertension      Social History   Socioeconomic History   Marital status:  Married    Spouse name: Not on file   Number of children: Not on file   Years of education: Not on file   Highest education level: Not on file  Occupational History   Not on file  Tobacco Use   Smoking status: Every Day    Packs/day: 1.00    Types: Cigarettes   Smokeless tobacco: Never  Vaping Use   Vaping Use: Never used  Substance and Sexual Activity   Alcohol use: Yes   Drug use: Not Currently   Sexual activity: Yes  Other Topics Concern   Not on file  Social History Narrative   Not on file   Social Determinants of Health   Financial Resource Strain: Not on file  Food Insecurity: Not on file  Transportation Needs: Not on file  Physical Activity: Not on file  Stress: Not on file  Social Connections: Not on file  Intimate Partner Violence: Not on file    Past Surgical History:  Procedure Laterality Date   CORONARY STENT INTERVENTION N/A 11/01/2021   Procedure: CORONARY STENT INTERVENTION;  Surgeon: Troy Sine, MD;  Location: [facility];  Service: Cardiovascular;  Laterality: N/A;   LEFT HEART CATH AND CORONARY ANGIOGRAPHY N/A 11/01/2021   Procedure: LEFT HEART CATH AND CORONARY ANGIOGRAPHY;  Surgeon: Troy Sine, MD;  Location: [facility];  Service: Cardiovascular;  Laterality: N/A;   TONSILLECTOMY      Family History  Problem Relation Age of Onset   Diabetes Mother    Arthritis Father  Diabetes Daughter     No Known Allergies  Current Outpatient Medications on File Prior to Visit  Medication Sig Dispense Refill   aspirin EC 81 MG tablet Take 81 mg by mouth daily.     atorvastatin (LIPITOR) 80 MG tablet Take 1 tablet (80 mg total) by mouth daily. 90 tablet 1   clopidogrel (PLAVIX) 75 MG tablet Take 1 tablet (75 mg total) by mouth daily. 90 tablet 4   fluorouracil (EFUDEX) 5 % cream Apply 1(one) application topically 2(two) times a day for 2-4 weeks 40 g 0   furosemide (LASIX) 20 MG tablet Take 1 tablet (20 mg total) by mouth daily.  AS NEEDED ONLY FOR SWELLING 90 tablet 3   hydrochlorothiazide (MICROZIDE) 12.5 MG capsule Take 1 capsule (12.5 mg total) by mouth daily. 30 capsule 3   LORazepam (ATIVAN) 0.5 MG tablet Take 1 tablet (0.5 mg total) by mouth 2 (two) times daily as needed for anxiety. 30 tablet 3   losartan (COZAAR) 100 MG tablet Take 1 tablet (100 mg total) by mouth daily. 90 tablet 3   Multiple Vitamin (MULTIVITAMIN WITH MINERALS) TABS tablet Take 1 tablet by mouth daily.     nitroGLYCERIN (NITROSTAT) 0.4 MG SL tablet Place 1 tablet (0.4 mg total) under the tongue every 5 (five) minutes as needed. 25 tablet 2   traZODone (DESYREL) 50 MG tablet Take 0.5-1 tablets (25-50 mg total) by mouth at bedtime as needed for sleep. 30 tablet 11   venlafaxine XR (EFFEXOR XR) 37.5 MG 24 hr capsule Take 1 capsule (37.5 mg total) by mouth daily with breakfast. 90 capsule 3   nebivolol (BYSTOLIC) 10 MG tablet Take 1 tablet (10 mg total) by mouth daily for 14 days. 14 tablet 0   No current facility-administered medications on file prior to visit.    BP (!) 140/84   Pulse 84   Temp 98.5 F (36.9 C)   Resp 18   Ht '5\' 9"'$  (1.753 m)   Wt 158 lb 12.8 oz (72 kg)   SpO2 95%   BMI 23.45 kg/m        Objective:   Physical Exam  General Mental Status- Alert. General Appearance- Not in acute distress.   Skin General: Color- Normal Color. Moisture- Normal Moisture.  Neck Carotid Arteries- Normal color. Moisture- Normal Moisture. No carotid bruits. No JVD.  Chest and Lung Exam Auscultation: Breath Sounds:-Normal.  Cardiovascular Auscultation:Rythm- Regular. Murmurs & Other Heart Sounds:Auscultation of the heart reveals- No Murmurs.  Abdomen Inspection:-Inspeection Normal. Palpation/Percussion:Note:No mass. Palpation and Percussion of the abdomen reveal- Non Tender, Non Distended + BS, no rebound or guarding.   Neurologic Cranial Nerve exam:- CN III-XII intact(No nystagmus), symmetric smile. Drift Test:- No  drift. Romberg Exam:- Negative.  Heal to Toe Gait exam:-Normal.(But pt states on first steps felt legs shake) Finger to Nose:- Normal/Intact Strength:- 5/5 equal and symmetric strength both upper and lower extremities.  On exam- slight minimal resting tremor left hand. Rt side upper ext tremor not present.       Assessment & Plan:   Patient Instructions  Tremors- glad to see neurologist next week. No aclohol use for about 10 days and still some tremors described. Continue to abstain from alcohol.   Sent to [facility] [place] [address] [place] [address] [phone]  Anxiety- you report overall mood better. I would still recommend using effexor at least every other day. Concern that anxiety might spike in light of relationship issues.  Htn- bp  well controlled today. Still keep eye on bp. If spiking above 140/90 let me know.  Follow up 1 month or sooner if needed.   Mackie Pai, PA-C

## 2022-06-05 NOTE — Patient Instructions (Addendum)
Tremors- glad to see neurologist next week. No alcohol use for about 10 days and still some tremors described. Continue to abstain from alcohol.   Sent to [facility] [place] [address] [place] [address] [phone]  Anxiety- you report overall mood better. I would still recommend using effexor at least every other day. Concern that anxiety might spike in light of relationship issues.  Htn- bp well controlled today. Still keep eye on bp. If spiking above 140/90 let me know.  Follow up 1 month or sooner if needed.

## 2022-06-08 NOTE — Progress Notes (Signed)
Cardiology Office Note:    Date:  06/11/2022   ID:  Eric Gillespie, DOB 1955-06-28, MRN [id number]  PCP:  Eric Gillespie   [facility] Providers Cardiologist:  Meriam Sprague, MD {  Referring MD: Esperanza Richters, PA-C    History of Present Illness:    Eric Gillespie is a 67 y.o. male with a hx of HTN, HLD, tobacco abuse, and anxiety who presents to clinic for follow-up.  Patient was initially seen in clinic 10/2021 for several episodes of chest pain with concern for progressive angina given multivessel CAD on CT chest. He ultimately underwent cardiac catheterization on 11/01/2021, which revealed 75% mid RCA disease as well as nonobstructive CAD in the left coronary circulation with 25% distal left main stenosis and 40% proximal circumflex stenosis. He underwent successful PCI to RCA with excellent angiographic results. Was discharged on DAPT. TTE 11/2021 showed LVEF 60-65%, normal RV, no significant valve disease.   Was seen in follow-up by Eligha Bridegroom, NP where he had continued to have chest discomfort and palpitations. Cardiac monitor 11/2021 showed NSR, 4 runs of nonsustained SVT with longest lasting 14.4s, rare SVE and VE.  Was last seen in clinic on 02/2022 where he was doing well. Rare episodes of chest pain. Continued to smoke.  Today, the patient overall feels okay. He has been having episodes of chest pain that can occur at rest or with exertion. He has been taking nitro intermittently. Each time he takes the nitro, his symptoms resolve within a couple of minutes.  One morning, he had an episode of chest pain, took a nitro and then passed out. Thinks his blood pressure dropped low as he felt dizzy prior to syncopizing. Since that time, he has not taken the nitro due to fear of passing out. Has also been having progressive dyspnea on exertion and states that he can have significant SOB even with walking 66feet. This did not start occurring until several months  after his stents. We discussed ischemic work-up at length today. He states he does not wish to pursue cath at this time. Discussed this is first line but he would prefer to start with medications and stress testing and only pursue cath if testing is positive or if symptoms persist/worsen.  Also has been having significant tremors and has been seen by Neurology and has been started on primidone, which has significantly helped his symptoms.   Blood pressure has been in 120-130s recently. Previously was in the 150s.     Past Medical History:  Diagnosis Date   Allergy    Anxiety    Hyperlipidemia    Hypertension     Past Surgical History:  Procedure Laterality Date   CORONARY STENT INTERVENTION N/A 11/01/2021   Procedure: CORONARY STENT INTERVENTION;  Surgeon: Lennette Bihari, MD;  Location: [facility];  Service: Cardiovascular;  Laterality: N/A;   LEFT HEART CATH AND CORONARY ANGIOGRAPHY N/A 11/01/2021   Procedure: LEFT HEART CATH AND CORONARY ANGIOGRAPHY;  Surgeon: Lennette Bihari, MD;  Location: [facility] INVAS[facility] CV [facility];  Service: Cardiovascular;  Laterality: N/A;   TONSILLECTOMY      Current Medications: Current Meds  Medication Sig   aspirin EC 81 MG tablet Take 81 mg by mouth daily.   atorvastatin (LIPITOR) 80 MG tablet Take 1 tablet (80 mg total) by mouth daily.   clopidogrel (PLAVIX) 75 MG tablet Take 1 tablet (75 mg total) by mouth daily.   isosorbide mononitrate (IMDUR) 30 MG 24  hr tablet Take 0.5 tablets (15 mg total) by mouth daily.   losartan (COZAAR) 100 MG tablet Take 1 tablet (100 mg total) by mouth daily.   nebivolol (BYSTOLIC) 10 MG tablet Take 1 tablet (10 mg total) by mouth daily for 14 days.     Allergies:   Patient has no known allergies.   Social History   Socioeconomic History   Marital status: Married    Spouse name: Not on file   Number of children: Not on file   Years of education: Not on file   Highest education level: Not on file  Occupational  History   Not on file  Tobacco Use   Smoking status: Every Day    Packs/day: 1.00    Types: Cigarettes   Smokeless tobacco: Never  Vaping Use   Vaping Use: Never used  Substance and Sexual Activity   Alcohol use: Yes   Drug use: Not Currently   Sexual activity: Yes  Other Topics Concern   Not on file  Social History Narrative   Not on file   Social Determinants of Health   Financial Resource Strain: Not on file  Food Insecurity: Not on file  Transportation Needs: Not on file  Physical Activity: Not on file  Stress: Not on file  Social Connections: Not on file     Family History: The patient's family history includes Arthritis in his father; Diabetes in his daughter and mother.  ROS:   Review of Systems  Constitutional:  Positive for malaise/fatigue. Negative for chills and fever.  HENT:  Negative for hearing loss and tinnitus.   Eyes:  Negative for blurred vision and double vision.  Respiratory:  Negative for cough and hemoptysis.   Cardiovascular:  Positive for chest pain and palpitations. Negative for orthopnea, claudication, leg swelling (bilateral ankles) and PND.  Gastrointestinal:  Negative for heartburn and nausea.  Genitourinary:  Negative for dysuria and urgency.  Musculoskeletal:  Negative for falls and neck pain.  Skin:  Negative for itching and rash.  Neurological:  Positive for tremors and loss of consciousness. Negative for dizziness and headaches.  Endo/Heme/Allergies:  Negative for environmental allergies. Does not bruise/bleed easily.  Psychiatric/Behavioral:  Negative for depression and suicidal ideas.      EKGs/Labs/Other Studies Reviewed:    The following studies were reviewed today: Cath 10/2021: CORONARY STENT INTERVENTION  LEFT HEART CATH AND CORONARY ANGIOGRAPHY   Conclusion      Prox RCA lesion is 30% stenosed.   Mid RCA-1 lesion is 75% stenosed.   Mid RCA-2 lesion is 40% stenosed.   Prox Cx lesion is 40% stenosed.   Mid LM to Dist  LM lesion is 25% stenosed.   A drug-eluting stent was successfully placed.   Post intervention, there is a 0% residual stenosis.   Post intervention, there is a 0% residual stenosis.   The left ventricular systolic function is normal.   LV end diastolic pressure is normal.   Significant CAD involving a large dominant RCA with 30% smooth proximal stenosis, and 75% eccentric stenosis in the RCA followed by 40% narrowing.   Mild nonobstructive CAD in the left coronary circulation with smooth 25% distal left main stenosis and 40% proximal circumflex stenosis.   Preserved global LV function with EF estimated at 55% but with small area of mid inferior hypocontractility.  LVEDP 9 mmHg.   Successful PCI to the mid RCA with insertion of a 3.0 x 20 mm Synergy stent postdilated to 3.5 mm with the  stenosis improved to 0%.  It is notable that the patient had significant coronary vasodilation following IC nitroglycerin.   RECOMMENDATION: DAPT for minimum of 6 months but preferably 12 months.  Medical therapy for concomitant CAD.  Smoking cessation is essential.  Aggressive lipid-lowering therapy with target LDL less than 70.  Plan for discharge later today if he remains stable.  TTE 11/2021: IMPRESSIONS     1. Left ventricular ejection fraction, by estimation, is 60 to 65%. Left  ventricular ejection fraction by 3D volume is 65 %. The left ventricle has  normal function. The left ventricle has no regional wall motion  abnormalities. Left ventricular diastolic   parameters were normal. The average left ventricular global longitudinal  strain is -18.6 %. The global longitudinal strain is normal.   2. Right ventricular systolic function is normal. The right ventricular  size is normal.   3. The mitral valve is normal in structure. No evidence of mitral valve  regurgitation. No evidence of mitral stenosis.   4. The aortic valve is tricuspid. Aortic valve regurgitation is not  visualized. Aortic valve  sclerosis/calcification is present, without any  evidence of aortic stenosis.   5. The inferior vena cava is normal in size with greater than 50%  respiratory variability, suggesting right atrial pressure of 3 mmHg.    Cardiac Monitor 11/2021:    Patch wear time was 6 days and 22 hours   Predominant rhythm is NSR with average HR 74bpm   4 runs of nonsustained SVT with longest lasting 14.4s   Rare SVE (<1%), rare VE (<1%)   No sustained arrhythmias or significant pauses     Patch Wear Time:  6 days and 22 hours (2023-07-30T15:14:37-0400 to 2023-08-06T13:52:35-0400)   Patient had a min HR of 51 bpm, max HR of 143 bpm, and avg HR of 74 bpm. Predominant underlying rhythm was Sinus Rhythm. 4 Supraventricular Tachycardia runs occurred, the run with the fastest interval lasting 4 beats with a max rate of 143 bpm, the  longest lasting 14.4 secs with an avg rate of 93 bpm. Isolated SVEs were rare (<1.0%), SVE Couplets were rare (<1.0%), and SVE Triplets were rare (<1.0%). Isolated VEs were rare (<1.0%), VE Couplets were rare (<1.0%), and no VE Triplets were present.  Ventricular Bigeminy was present.   CT chest 05/2021: FINDINGS: Cardiovascular: Heart size is normal. There is no significant pericardial fluid, thickening or pericardial calcification. There is aortic atherosclerosis, as well as atherosclerosis of the great vessels of the mediastinum and the coronary arteries, including calcified atherosclerotic plaque in the left main, left anterior descending and right coronary arteries.   Mediastinum/Nodes: No pathologically enlarged mediastinal or hilar lymph nodes. Please note that accurate exclusion of hilar adenopathy is limited on noncontrast CT scans. Esophagus is unremarkable in appearance. No axillary lymphadenopathy.   Lungs/Pleura: No suspicious appearing pulmonary nodules or masses are noted. No acute consolidative airspace disease. No pleural effusions. Diffuse bronchial wall  thickening with mild centrilobular and paraseptal emphysema   Upper Abdomen: Aortic atherosclerosis.   Musculoskeletal: Bilateral gynecomastia. There are no aggressive appearing lytic or blastic lesions noted in the visualized portions of the skeleton.   IMPRESSION: 1. Lung-RADS 1S, negative. Continue annual screening with low-dose chest CT without contrast in 12 months. 2. The "S" modifier above refers to potentially clinically significant non lung cancer related findings. Specifically, there is aortic atherosclerosis, in addition to left main and 2 vessel coronary artery disease. Please note that although the presence of coronary artery  calcium documents the presence of coronary artery disease, the severity of this disease and any potential stenosis cannot be assessed on this non-gated CT examination. Assessment for potential risk factor modification, dietary therapy or pharmacologic therapy may be warranted, if clinically indicated. 3. Mild diffuse bronchial wall thickening with mild centrilobular and paraseptal emphysema; imaging findings suggestive of underlying COPD.   Aortic Atherosclerosis (ICD10-I70.0) and Emphysema (ICD10-J43.9).  EKG: EKG is personally reviewed.  02/21/2022:No new ECG today 10/17/21  sinus bradycardia. No acute ischemic changes  Recent Labs: 05/21/2022: ALT 12; BUN 7; Creatinine, Ser 0.84; Hemoglobin 13.9; Platelets 319.0; Potassium 4.7; Sodium 132; TSH 1.76  Recent Lipid Panel    Component Value Date/Time   CHOL 121 12/12/2021 1048   TRIG 198 (H) 12/12/2021 1048   HDL 43 12/12/2021 1048   CHOLHDL 2.8 12/12/2021 1048   CHOLHDL 3 09/18/2021 1654   VLDL 54.4 (H) 09/18/2021 1654   LDLCALC 46 12/12/2021 1048   LDLDIRECT 60.0 09/18/2021 1654     Risk Assessment/Calculations:           Physical Exam:    VS:  BP (!) 152/80   Pulse 79   Ht 5\' 9"  (1.753 m)   Wt 165 lb (74.8 kg)   SpO2 97%   BMI 24.37 kg/m     Wt Readings from Last 3  Encounters:  06/11/22 165 lb (74.8 kg)  06/05/22 158 lb 12.8 oz (72 kg)  05/21/22 164 lb (74.4 kg)     GEN:  Well nourished, well developed in no acute distress HEENT: Normal NECK: No JVD; No carotid bruits CARDIAC: RRR, no murmurs RESPIRATORY:  Diminished but clear ABDOMEN: Soft, non-tender, non-distended MUSCULOSKELETAL:  Trace-1+ bilateral LE edema; No deformity  NEUROLOGIC:  Alert and oriented x 3 PSYCHIATRIC:  Normal affect   ASSESSMENT:    1. Coronary artery disease of native artery of native heart with stable angina pectoris (HCC)   2. Essential hypertension   3. Tobacco abuse   4. Primary hypertension   5. Hyperlipidemia LDL goal <70   6. Status post coronary artery stent placement   7. Palpitations   8. Precordial pain     PLAN:    In order of problems listed above:  #CAD with history of PCI: #Chest pain: Cath with 75% mid-RCA s/p PCI with mild nonobstructive disease elsewhere. Currently, with more episodes of chest pain. Notably, he has significant response to IC nitro in the cath lab and therefore this could be related to vasospasm as symptoms are not exertional and unpredictable. His progressive DOE is concernng, however. We did discuss cath but he does not wish to puruse invasive measures at this time. Will therefore start imdur and check cardiac PET. He is willing to pursue cath if symptoms persist/worsen of if stress testing concerning.  -Declined cath -Check cardiac PET -Start imdur -Continue ASA 81mg  daily -Continue plavix 75mg  daily -Continue lipitor 80mg  daily -Continue bystolic 10mg  daily -Continue losartan 50mg  daily -Encouraged tobacco cessation as may also be driving vasospasm  #Palpitations: Zio with nonsustained runs of SVT lasting up to 14s but overall rare ectopy and no Afib. Currently, symptoms are improved. Working on cutting back on caffeine. -Continue bystolic 10mg  daily  #HTN: Running 120-130s at home. -Continue 100mg  daily -Continue  bystolic 10mg  daily -Start imdur 15mg  daily  #HLD: -Continue lipitor 80mg  daily -LDL well controlled at 46  #Tobacco Abuse: -Tobacco cessation encouraged       Follow Up: 6 months Medication Adjustments/Labs and Tests Ordered: Current  medicines are reviewed at length with the patient today.  Concerns regarding medicines are outlined above.  Orders Placed This Encounter  Procedures   NM PET CT CARDIAC PERFUSION MULTI W/ABSOLUTE BLOODFLOW   Cardiac Stress Test: Informed Consent Details: Physician/Practitioner Attestation; Transcribe to consent form and obtain patient signature   Meds ordered this encounter  Medications   isosorbide mononitrate (IMDUR) 30 MG 24 hr tablet    Sig: Take 0.5 tablets (15 mg total) by mouth daily.    Dispense:  45 tablet    Refill:  1    Patient Instructions  Medication Instructions:   START TAKING ISOSORBIDE MONONITRATE (IMDUR) 15 MG BY MOUTH DAILY   *If you need a refill on your cardiac medications before your next appointment, please call your pharmacy*    Testing/Procedures:  How to Prepare for Your Cardiac PET/CT Stress Test:  1. Please do not take these medications before your test:   Medications that may interfere with the cardiac pharmacological stress agent (ex. nitrates - including erectile dysfunction medications, isosorbide mononitrate, tamulosin or beta-blockers) the day of the exam. (Erectile dysfunction medication should be held for at least 72 hrs prior to test)--DO NOT TAKE NITROGLYCERIN, ISOSORBIDE MONONITRATE (IMDUR), AND NEBIVOLOL (BYSTOLIC) THE DAY OF THIS EXAM.  Your remaining medications may be taken with water.  2. Nothing to eat or drink, except water, 3 hours prior to arrival time.   NO caffeine/decaffeinated products, or chocolate 12 hours prior to arrival.  3. NO perfume, cologne or lotion  4. Total time is 1 to 2 hours; you may want to bring reading material for the waiting time.  5. Please report to  Radiology at the [facility] Main Entrance 30 minutes early for your test.  [address] [place], [place] [address]    In preparation for your appointment, medication and supplies will be purchased.  Appointment availability is limited, so if you need to cancel or reschedule, please call the Radiology Department at [phone]  24 hours in advance to avoid a cancellation fee of $100.00  What to Expect After you Arrive:  Once you arrive and check in for your appointment, you will be taken to a preparation room within the Radiology Department.  A technologist or Nurse will obtain your medical history, verify that you are correctly prepped for the exam, and explain the procedure.  Afterwards,  an IV will be started in your arm and electrodes will be placed on your skin for EKG monitoring during the stress portion of the exam. Then you will be escorted to the PET/CT scanner.  There, staff will get you positioned on the scanner and obtain a blood pressure and EKG.  During the exam, you will continue to be connected to the EKG and blood pressure machines.  A small, safe amount of a radioactive tracer will be injected in your IV to obtain a series of pictures of your heart along with an injection of a stress agent.    After your Exam:  It is recommended that you eat a meal and drink a caffeinated beverage to counter act any effects of the stress agent.  Drink plenty of fluids for the remainder of the day and urinate frequently for the first couple of hours after the exam.  Your doctor will inform you of your test results within 7-10 business days.  For questions about your test or how to prepare for your test, please call: Rockwell Alexandria, Cardiac Imaging Nurse Navigator  Larey Brick,  Cardiac Imaging Nurse Navigator Office: [phone]    Follow-Up:  3 MONTHS WITH AN EXTENDER IN THE OFFICE--PLEASE SCHEDULE WITH AN EXTENDER PER DR. Shari Prows      Signed, Meriam Sprague, MD   06/11/2022 10:58 AM    [facility]

## 2022-06-10 DIAGNOSIS — R251 Tremor, unspecified: Secondary | ICD-10-CM | POA: Diagnosis not present

## 2022-06-11 ENCOUNTER — Other Ambulatory Visit (HOSPITAL_BASED_OUTPATIENT_CLINIC_OR_DEPARTMENT_OTHER): Payer: Self-pay

## 2022-06-11 ENCOUNTER — Ambulatory Visit: Payer: Medicaid Other | Attending: Cardiology | Admitting: Cardiology

## 2022-06-11 ENCOUNTER — Encounter: Payer: Self-pay | Admitting: Cardiology

## 2022-06-11 VITALS — BP 152/80 | HR 79 | Ht 69.0 in | Wt 165.0 lb

## 2022-06-11 DIAGNOSIS — Z720 Tobacco use: Secondary | ICD-10-CM | POA: Diagnosis not present

## 2022-06-11 DIAGNOSIS — Z955 Presence of coronary angioplasty implant and graft: Secondary | ICD-10-CM

## 2022-06-11 DIAGNOSIS — R002 Palpitations: Secondary | ICD-10-CM

## 2022-06-11 DIAGNOSIS — I10 Essential (primary) hypertension: Secondary | ICD-10-CM | POA: Diagnosis not present

## 2022-06-11 DIAGNOSIS — I25118 Atherosclerotic heart disease of native coronary artery with other forms of angina pectoris: Secondary | ICD-10-CM

## 2022-06-11 DIAGNOSIS — R072 Precordial pain: Secondary | ICD-10-CM | POA: Diagnosis not present

## 2022-06-11 DIAGNOSIS — E785 Hyperlipidemia, unspecified: Secondary | ICD-10-CM | POA: Diagnosis not present

## 2022-06-11 MED ORDER — ISOSORBIDE MONONITRATE ER 30 MG PO TB24
15.0000 mg | ORAL_TABLET | Freq: Every day | ORAL | 1 refills | Status: DC
  Filled 2022-06-11: qty 45, 90d supply, fill #0

## 2022-06-11 NOTE — Patient Instructions (Signed)
Medication Instructions:   START TAKING ISOSORBIDE MONONITRATE (IMDUR) 15 MG BY MOUTH DAILY   *If you need a refill on your cardiac medications before your next appointment, please call your pharmacy*    Testing/Procedures:  How to Prepare for Your Cardiac PET/CT Stress Test:  1. Please do not take these medications before your test:   Medications that may interfere with the cardiac pharmacological stress agent (ex. nitrates - including erectile dysfunction medications, isosorbide mononitrate, tamulosin or beta-blockers) the day of the exam. (Erectile dysfunction medication should be held for at least 72 hrs prior to test)--DO NOT TAKE NITROGLYCERIN, ISOSORBIDE MONONITRATE (IMDUR), AND NEBIVOLOL (BYSTOLIC) THE DAY OF THIS EXAM.  Your remaining medications may be taken with water.  2. Nothing to eat or drink, except water, 3 hours prior to arrival time.   NO caffeine/decaffeinated products, or chocolate 12 hours prior to arrival.  3. NO perfume, cologne or lotion  4. Total time is 1 to 2 hours; you may want to bring reading material for the waiting time.  5. Please report to Radiology at the [facility] Main Entrance 30 minutes early for your test.  [place], [place] [address]    In preparation for your appointment, medication and supplies will be purchased.  Appointment availability is limited, so if you need to cancel or reschedule, please call the Radiology Department at [phone]  24 hours in advance to avoid a cancellation fee of $100.00  What to Expect After you Arrive:  Once you arrive and check in for your appointment, you will be taken to a preparation room within the Radiology Department.  A technologist or Nurse will obtain your medical history, verify that you are correctly prepped for the exam, and explain the procedure.  Afterwards,  an IV will be started in your arm and electrodes will be placed on your skin for EKG monitoring during the  stress portion of the exam. Then you will be escorted to the PET/CT scanner.  There, staff will get you positioned on the scanner and obtain a blood pressure and EKG.  During the exam, you will continue to be connected to the EKG and blood pressure machines.  A small, safe amount of a radioactive tracer will be injected in your IV to obtain a series of pictures of your heart along with an injection of a stress agent.    After your Exam:  It is recommended that you eat a meal and drink a caffeinated beverage to counter act any effects of the stress agent.  Drink plenty of fluids for the remainder of the day and urinate frequently for the first couple of hours after the exam.  Your doctor will inform you of your test results within 7-10 business days.  For questions about your test or how to prepare for your test, please call: Marchia Bond, Cardiac Imaging Nurse Navigator  Gordy Clement, Cardiac Imaging Nurse Navigator Office: [phone]    Follow-Up:  3 MONTHS WITH AN EXTENDER IN THE OFFICE--PLEASE SCHEDULE WITH AN EXTENDER PER DR. Johney Frame

## 2022-06-13 ENCOUNTER — Other Ambulatory Visit: Payer: Self-pay | Admitting: *Deleted

## 2022-06-13 ENCOUNTER — Other Ambulatory Visit: Payer: Self-pay

## 2022-06-13 ENCOUNTER — Other Ambulatory Visit (HOSPITAL_BASED_OUTPATIENT_CLINIC_OR_DEPARTMENT_OTHER): Payer: Self-pay

## 2022-06-13 DIAGNOSIS — E785 Hyperlipidemia, unspecified: Secondary | ICD-10-CM

## 2022-06-13 DIAGNOSIS — R002 Palpitations: Secondary | ICD-10-CM

## 2022-06-13 DIAGNOSIS — Z955 Presence of coronary angioplasty implant and graft: Secondary | ICD-10-CM

## 2022-06-13 DIAGNOSIS — I10 Essential (primary) hypertension: Secondary | ICD-10-CM

## 2022-06-13 DIAGNOSIS — Z720 Tobacco use: Secondary | ICD-10-CM

## 2022-06-13 DIAGNOSIS — I25118 Atherosclerotic heart disease of native coronary artery with other forms of angina pectoris: Secondary | ICD-10-CM

## 2022-06-13 MED ORDER — ISOSORBIDE MONONITRATE ER 30 MG PO TB24: 15.0000 mg | ORAL_TABLET | Freq: Every day | ORAL | 1 refills | Status: DC

## 2022-06-18 ENCOUNTER — Telehealth: Payer: Self-pay | Admitting: Cardiology

## 2022-06-18 NOTE — Telephone Encounter (Signed)
Pt is calling in to state his Cardiac PET Scan was denied and insurance company is needing more information.  Pt is aware that we were notified of this earlier this afternoon and I sent a message to our PET Scheduling team to follow-up with his insurance about the pre-cert on this and providing Dr. Jacolyn Reedy last OV note on the pt to support the need.  Pt said insurance said that there was no mention that he cannot due a treadmill test and reach a certain target HR due to limitations, and that's why they are denying this at this time.   Pt aware that our pre-cert team will send her last OV note and we will go from there.  He is aware that if they are still denying the test with that information, then Dr. Johney Frame will order for him to have another alternative test done, and we will notify him of that.   Pt is aware that I will route this message to Dr. Johney Frame and our PET Scheduling team as well.  Pt verbalized understanding and agrees with this plan.

## 2022-06-18 NOTE — Telephone Encounter (Signed)
Will route this message to [facility] PET Scheduling pool for further follow-up and management of this message, for they handle scheduling and pre-certing all Cardiac PET Scans ordered by our office.    Will also cc [facility] to send in last OV notes as requested in this message.

## 2022-06-18 NOTE — Telephone Encounter (Signed)
Patient's wife is calling to speak with Dr. Johney Frame or nurse. Please call back

## 2022-06-18 NOTE — Telephone Encounter (Signed)
   Eric Gillespie - [employer] calling, she said, if we can resend prior auth for pt's PET scan and include OV notes to why pt needs it

## 2022-06-20 DIAGNOSIS — I6781 Acute cerebrovascular insufficiency: Secondary | ICD-10-CM | POA: Diagnosis not present

## 2022-06-20 DIAGNOSIS — R251 Tremor, unspecified: Secondary | ICD-10-CM | POA: Diagnosis not present

## 2022-06-25 ENCOUNTER — Ambulatory Visit: Payer: Medicaid Other | Admitting: Cardiology

## 2022-07-03 ENCOUNTER — Ambulatory Visit (INDEPENDENT_AMBULATORY_CARE_PROVIDER_SITE_OTHER): Payer: Medicaid Other | Admitting: Medical

## 2022-07-03 ENCOUNTER — Other Ambulatory Visit (HOSPITAL_BASED_OUTPATIENT_CLINIC_OR_DEPARTMENT_OTHER): Payer: Self-pay

## 2022-07-03 ENCOUNTER — Encounter: Payer: Self-pay | Admitting: Medical

## 2022-07-03 ENCOUNTER — Ambulatory Visit (HOSPITAL_BASED_OUTPATIENT_CLINIC_OR_DEPARTMENT_OTHER)
Admission: RE | Admit: 2022-07-03 | Discharge: 2022-07-03 | Disposition: A | Discharge status: Home / Self Care | Payer: Medicaid Other | Source: Ambulatory Visit | Attending: Medical | Admitting: Medical

## 2022-07-03 VITALS — BP 130/80 | HR 70 | Temp 98.0°F | Resp 18 | Ht 69.0 in | Wt 161.0 lb

## 2022-07-03 DIAGNOSIS — R059 Cough, unspecified: Secondary | ICD-10-CM | POA: Diagnosis not present

## 2022-07-03 DIAGNOSIS — F17200 Nicotine dependence, unspecified, uncomplicated: Secondary | ICD-10-CM

## 2022-07-03 DIAGNOSIS — R251 Tremor, unspecified: Secondary | ICD-10-CM | POA: Diagnosis not present

## 2022-07-03 DIAGNOSIS — I700 Atherosclerosis of aorta: Secondary | ICD-10-CM

## 2022-07-03 DIAGNOSIS — R0600 Dyspnea, unspecified: Secondary | ICD-10-CM

## 2022-07-03 DIAGNOSIS — J40 Bronchitis, not specified as acute or chronic: Secondary | ICD-10-CM

## 2022-07-03 MED ORDER — TRAZODONE HCL 50 MG PO TABS
25.0000 mg | ORAL_TABLET | Freq: Every evening | ORAL | 11 refills | Status: DC | PRN
  Filled 2022-07-03: qty 30, 30d supply, fill #0

## 2022-07-03 MED ORDER — AZITHROMYCIN 250 MG PO TABS
ORAL_TABLET | ORAL | 0 refills | Status: AC
  Filled 2022-07-03: qty 6, 5d supply, fill #0

## 2022-07-03 MED ORDER — BUDESONIDE-FORMOTEROL FUMARATE 160-4.5 MCG/ACT IN AERO
2.0000 | INHALATION_SPRAY | Freq: Two times a day (BID) | RESPIRATORY_TRACT | 3 refills | Status: DC
  Filled 2022-07-03: qty 10.2, 30d supply, fill #0
  Filled 2022-08-07: qty 10.2, 30d supply, fill #1
  Filled 2022-09-04: qty 10.2, 30d supply, fill #2
  Filled 2022-10-15: qty 10.2, 30d supply, fill #3

## 2022-07-03 MED ORDER — PRIMIDONE 50 MG PO TABS
50.0000 mg | ORAL_TABLET | Freq: Two times a day (BID) | ORAL | 2 refills | Status: DC
  Filled 2022-07-03: qty 60, 30d supply, fill #0
  Filled 2022-08-07: qty 60, 30d supply, fill #1
  Filled 2022-09-04: qty 60, 30d supply, fill #2

## 2022-07-03 NOTE — Patient Instructions (Addendum)
Tremor  Cough, unspecified type -     DG Chest 2 View; Future  Bronchitis  Atherosclerosis of aorta (HCC)  Smoker -     CT CHEST LUNG CANCER SCREENING LOW DOSE WO CONTRAST; Future -     Ambulatory referral to Pulmonology -     DG Chest 2 View; Future  Dyspnea, unspecified type -     Ambulatory referral to Pulmonology  Other orders -     Primidone; Take 1 tablet (50 mg total) by mouth 2 (two) times daily.  Dispense: 60 tablet; Refill: 2 -     Budesonide-Formoterol Fumarate; Inhale 2 puffs into the lungs 2 (two) times daily.  Dispense: 1 each; Refill: 3 -     Azithromycin; Take 2 tablets on day 1, then 1 tablet daily on days 2 through 5  Dispense: 6 tablet; Refill: 0  Tremor resolved with primidone. Rx refill today. Follow up with neurologist as scheduled.  For bronchitis and hx of smoking with some dyspnea. Rx azithromycin, symbicort inhaler and cxr today.  Some atherosclerois of aorta and heart vessles on ct chest in past. Recommend follow thru with cardiologist work up stress test or other studies as indicated.  Follow up in 3 months or sooner if needed

## 2022-07-03 NOTE — Addendum Note (Signed)
Addended by: Anabel Halon on: 07/03/2022 10:54 AM   Modules accepted: Orders

## 2022-07-03 NOTE — Progress Notes (Signed)
Subjective:    Patient ID: Eric Gillespie, male    DOB: Aug 18, 1955, 67 y.o.   MRN: [id number]  HPI  Pt in for follow up.   Pt has seen neurologist who gave primidone 50 mg twice daily for tremor.    TECHNIQUE: Multiplanar, multisequence MR imaging of the brain before and after IV contrast administration.  CONTRAST: Gadavist 7.5 mL.  COMPARISON: Head CT without contrast 05/09/2022.  FINDINGS: Mild-to-moderate leukoaraiosis.  Diffusion imaging shows no hyperacute, acute, or early subacute infarction. No intracranial mass or mass effect.   Assessment   67 y.o. male with PMHx as above who presents for evaluation of tremors. Today exam was positive for bilateral hand tremors appreciated with action. Tremors do not fit a parkinsonian description. He is also having gait instability as well because of this we will order a brain MRI with and without contrast to get the cerebellar centers and basal ganglia symptoms as well. Will also check a ceruloplasmin and copper finally will start patient on primidone.  Plan  Tremors: - Primidone titrate up to 100 mg daily - Brain MRI with and without contrast - Ceruloplasmin - Copper  Risks, benefits, and alternatives of the medications and treatment plan prescribed today were discussed, and patient expressed understanding and agreement with the plan.  All new prescription medications and changes in current prescription dosages were discussed with the patient, including patient education, medication name, use, dosage, potential side effects, drug interactions, consequences of not using/taking and special instructions. Patient expressed understanding. No barriers to adherence.   No hydrocephalus. Normal signal voids in the larger intracranial vessels. The paranasal sinuses are clear. The tympanic cavities and mastoid air cells are clear. The marrow signal pattern is within normal limits. No abnormal brain parenchymal or leptomeningeal  enhancement.    Pt tells me that primidone works quickly for tremors and continues to work. Pt states able to work now with no tremors.    Also recent night times cough that sounds wet for 2 weeks. No fever, no chills or sweats. Feels cold easier. Some occasional mucus production. Some yellow tinge. Pt does smoke. Pack a day for more than 50 years.   Pt had ct of chest done in past by pulmonologist.       Review of Systems  Constitutional:  Negative for chills, fatigue and fever.  Respiratory:  Negative for cough, chest tightness, shortness of breath and wheezing.   Cardiovascular:  Negative for chest pain and palpitations.  Gastrointestinal:  Negative for abdominal pain, nausea and vomiting.  Genitourinary:  Negative for flank pain.  Musculoskeletal:  Negative for back pain, joint swelling and neck pain.  Skin:  Negative for rash.  Neurological:  Negative for dizziness, tremors, seizures, weakness and headaches.       No further tremors.  Hematological:  Negative for adenopathy. Does not bruise/bleed easily.  Psychiatric/Behavioral:  Negative for behavioral problems and decreased concentration.     Past Medical History:  Diagnosis Date   Allergy    Anxiety    Hyperlipidemia    Hypertension      Social History   Socioeconomic History   Marital status: Married    Spouse name: Not on file   Number of children: Not on file   Years of education: Not on file   Highest education level: Not on file  Occupational History   Not on file  Tobacco Use   Smoking status: Every Day    Packs/day: 1    Types:  Cigarettes   Smokeless tobacco: Never  Vaping Use   Vaping Use: Never used  Substance and Sexual Activity   Alcohol use: Yes   Drug use: Not Currently   Sexual activity: Yes  Other Topics Concern   Not on file  Social History Narrative   Not on file   Social Determinants of Health   Financial Resource Strain: Not on file  Food Insecurity: Not on file   Transportation Needs: Not on file  Physical Activity: Not on file  Stress: Not on file  Social Connections: Not on file  Intimate Partner Violence: Not on file    Past Surgical History:  Procedure Laterality Date   CORONARY STENT INTERVENTION N/A 11/01/2021   Procedure: CORONARY STENT INTERVENTION;  Surgeon: Troy Sine, MD;  Location: [facility];  Service: Cardiovascular;  Laterality: N/A;   LEFT HEART CATH AND CORONARY ANGIOGRAPHY N/A 11/01/2021   Procedure: LEFT HEART CATH AND CORONARY ANGIOGRAPHY;  Surgeon: Troy Sine, MD;  Location: [facility];  Service: Cardiovascular;  Laterality: N/A;   TONSILLECTOMY      Family History  Problem Relation Age of Onset   Diabetes Mother    Arthritis Father    Diabetes Daughter     No Known Allergies  Current Outpatient Medications on File Prior to Visit  Medication Sig Dispense Refill   aspirin EC 81 MG tablet Take 81 mg by mouth daily.     atorvastatin (LIPITOR) 80 MG tablet Take 1 tablet (80 mg total) by mouth daily. 90 tablet 1   clopidogrel (PLAVIX) 75 MG tablet Take 1 tablet (75 mg total) by mouth daily. 90 tablet 4   fluorouracil (EFUDEX) 5 % cream Apply 1(one) application topically 2(two) times a day for 2-4 weeks (Patient not taking: Reported on 06/11/2022) 40 g 0   furosemide (LASIX) 20 MG tablet Take 1 tablet (20 mg total) by mouth daily. AS NEEDED ONLY FOR SWELLING (Patient not taking: Reported on 06/11/2022) 90 tablet 3   hydrochlorothiazide (MICROZIDE) 12.5 MG capsule Take 1 capsule (12.5 mg total) by mouth daily. (Patient not taking: Reported on 06/11/2022) 30 capsule 3   isosorbide mononitrate (IMDUR) 30 MG 24 hr tablet Take 0.5 tablets (15 mg total) by mouth daily. 45 tablet 1   LORazepam (ATIVAN) 0.5 MG tablet Take 1 tablet (0.5 mg total) by mouth 2 (two) times daily as needed for anxiety. (Patient not taking: Reported on 06/11/2022) 30 tablet 3   losartan (COZAAR) 100 MG tablet Take 1 tablet (100 mg total)  by mouth daily. 90 tablet 3   Multiple Vitamin (MULTIVITAMIN WITH MINERALS) TABS tablet Take 1 tablet by mouth daily. (Patient not taking: Reported on 06/11/2022)     nebivolol (BYSTOLIC) 10 MG tablet Take 1 tablet (10 mg total) by mouth daily for 14 days. 14 tablet 0   nitroGLYCERIN (NITROSTAT) 0.4 MG SL tablet Place 1 tablet (0.4 mg total) under the tongue every 5 (five) minutes as needed. (Patient not taking: Reported on 06/11/2022) 25 tablet 2   traZODone (DESYREL) 50 MG tablet Take 0.5-1 tablets (25-50 mg total) by mouth at bedtime as needed for sleep. (Patient not taking: Reported on 06/11/2022) 30 tablet 11   venlafaxine XR (EFFEXOR XR) 37.5 MG 24 hr capsule Take 1 capsule (37.5 mg total) by mouth daily with breakfast. (Patient not taking: Reported on 06/11/2022) 90 capsule 3   No current facility-administered medications on file prior to visit.    BP 130/80   Pulse  70   Temp 98 F (36.7 C)   Resp 18   Ht 5\' 9"  (1.753 m)   Wt 161 lb (73 kg)   SpO2 100%   BMI 23.78 kg/m             Objective:   Physical Exam  General Mental Status- Alert. General Appearance- Not in acute distress.   Skin General: Color- Normal Color. Moisture- Normal Moisture.  Neck Carotid Arteries- Normal color. Moisture- Normal Moisture. No carotid bruits. No JVD.  Chest and Lung Exam Auscultation: Breath Sounds:-Normal.  Cardiovascular Auscultation:Rythm- Regular. Murmurs & Other Heart Sounds:Auscultation of the heart reveals- No Murmurs.  Abdomen Inspection:-Inspeection Normal. Palpation/Percussion:Note:No mass. Palpation and Percussion of the abdomen reveal- Non Tender, Non Distended + BS, no rebound or guarding.   Neurologic Cranial Nerve exam:- CN III-XII intact(No nystagmus), symmetric smile. Strength:- 5/5 equal and symmetric strength both upper and lower extremities.   Lower ext- no pedal edema.     Assessment & Plan:   Patient Instructions  Tremor  Cough, unspecified type -      DG Chest 2 View; Future  Bronchitis  Atherosclerosis of aorta (HCC)  Smoker -     CT CHEST LUNG CANCER SCREENING LOW DOSE WO CONTRAST; Future -     Ambulatory referral to Pulmonology -     DG Chest 2 View; Future  Dyspnea, unspecified type -     Ambulatory referral to Pulmonology  Other orders -     Primidone; Take 1 tablet (50 mg total) by mouth 2 (two) times daily.  Dispense: 60 tablet; Refill: 2 -     Budesonide-Formoterol Fumarate; Inhale 2 puffs into the lungs 2 (two) times daily.  Dispense: 1 each; Refill: 3 -     Azithromycin; Take 2 tablets on day 1, then 1 tablet daily on days 2 through 5  Dispense: 6 tablet; Refill: 0  Tremor reolved with primidone. Rx refill today. Follow up with neurologist as scheduled.  For bronchitis and hx of smoking with some dypsnea. Rx azithromycin, symbicort inhaler and cxr today.  Some atherosclerois of aorta and heart vessles on ct chest in past. Recommend follow thru with cardiologist work up stress test or other studies as indicated.  Follow up in 3 months or sooner if needed   [employer], PA-C

## 2022-07-17 ENCOUNTER — Ambulatory Visit (HOSPITAL_BASED_OUTPATIENT_CLINIC_OR_DEPARTMENT_OTHER)
Admission: RE | Admit: 2022-07-17 | Discharge: 2022-07-17 | Disposition: A | Discharge status: Home / Self Care | Payer: Medicaid Other | Source: Ambulatory Visit | Attending: Chiropractic Medicine | Admitting: Chiropractic Medicine

## 2022-07-17 ENCOUNTER — Ambulatory Visit (HOSPITAL_BASED_OUTPATIENT_CLINIC_OR_DEPARTMENT_OTHER)
Admission: RE | Admit: 2022-07-17 | Discharge: 2022-07-17 | Disposition: A | Discharge status: Home / Self Care | Payer: Medicaid Other | Source: Ambulatory Visit | Attending: Medical | Admitting: Medical

## 2022-07-17 ENCOUNTER — Other Ambulatory Visit (HOSPITAL_BASED_OUTPATIENT_CLINIC_OR_DEPARTMENT_OTHER): Payer: Self-pay | Admitting: Chiropractic Medicine

## 2022-07-17 DIAGNOSIS — M545 Low back pain, unspecified: Secondary | ICD-10-CM

## 2022-07-17 DIAGNOSIS — F17200 Nicotine dependence, unspecified, uncomplicated: Secondary | ICD-10-CM | POA: Diagnosis present

## 2022-07-17 DIAGNOSIS — G8929 Other chronic pain: Secondary | ICD-10-CM | POA: Insufficient documentation

## 2022-07-17 DIAGNOSIS — M5451 Vertebrogenic low back pain: Secondary | ICD-10-CM | POA: Diagnosis not present

## 2022-07-17 DIAGNOSIS — M9903 Segmental and somatic dysfunction of lumbar region: Secondary | ICD-10-CM | POA: Diagnosis not present

## 2022-07-17 DIAGNOSIS — M9904 Segmental and somatic dysfunction of sacral region: Secondary | ICD-10-CM | POA: Diagnosis not present

## 2022-07-17 DIAGNOSIS — M9905 Segmental and somatic dysfunction of pelvic region: Secondary | ICD-10-CM | POA: Diagnosis not present

## 2022-07-22 ENCOUNTER — Other Ambulatory Visit (HOSPITAL_BASED_OUTPATIENT_CLINIC_OR_DEPARTMENT_OTHER): Payer: Self-pay

## 2022-07-22 ENCOUNTER — Other Ambulatory Visit: Payer: Self-pay

## 2022-07-24 ENCOUNTER — Other Ambulatory Visit (HOSPITAL_BASED_OUTPATIENT_CLINIC_OR_DEPARTMENT_OTHER): Payer: Self-pay

## 2022-07-24 ENCOUNTER — Ambulatory Visit (INDEPENDENT_AMBULATORY_CARE_PROVIDER_SITE_OTHER): Payer: Medicaid Other | Admitting: Pulmonary Disease

## 2022-07-24 ENCOUNTER — Encounter: Payer: Self-pay | Admitting: Pulmonary Disease

## 2022-07-24 VITALS — BP 128/76 | HR 62 | Ht 69.0 in | Wt 159.0 lb

## 2022-07-24 DIAGNOSIS — M7989 Other specified soft tissue disorders: Secondary | ICD-10-CM

## 2022-07-24 DIAGNOSIS — R0683 Snoring: Secondary | ICD-10-CM | POA: Diagnosis not present

## 2022-07-24 DIAGNOSIS — R0609 Other forms of dyspnea: Secondary | ICD-10-CM

## 2022-07-24 DIAGNOSIS — J432 Centrilobular emphysema: Secondary | ICD-10-CM

## 2022-07-24 DIAGNOSIS — F17210 Nicotine dependence, cigarettes, uncomplicated: Secondary | ICD-10-CM

## 2022-07-24 MED ORDER — ALBUTEROL SULFATE HFA 108 (90 BASE) MCG/ACT IN AERS
2.0000 | INHALATION_SPRAY | Freq: Four times a day (QID) | RESPIRATORY_TRACT | 6 refills | Status: DC | PRN
Start: 2022-07-24 — End: 2022-11-06
  Filled 2022-07-24: qty 18, 25d supply, fill #0
  Filled 2022-09-04: qty 18, 25d supply, fill #1
  Filled 2022-09-19 – 2022-10-15 (×3): qty 18, 25d supply, fill #2

## 2022-07-24 NOTE — Progress Notes (Signed)
Synopsis: Referred in April 2024 for shortness of breath by Esperanza Richters, PA  Subjective:   PATIENT ID: Eric Gillespie, Eric Gillespie  HPI  Chief Complaint  Patient presents with   Consult    Referred by PCP for DOE for the past 4 months. States the DOE will go away with rest. Will wake up in the mornings with a runny nose and productive cough with clear phlegm.    Eric Gillespie is a 67 year old male, daily smoker with history of hypertension who is referred to pulmonary clinic for shortness of breath.   He reports developing shortness of breath that started 4 months ago.  The shortness of breath is mainly with exertion and he also notices shaking of his legs with significant exertional activity such as bringing the groceries into the house.  Does have some cough with the shortness of breath.  Denies much wheezing.  He does have significant snoring and witnessed apneas at night per his wife.  They have recently moved from [place] and are remodeling their home which they report is very dusty.  He denies any water or mold damage.  He is a daily smoker and is smoking 1 pack/day.  He has a 20+ pack year smoking history.  Most recent lung cancer screening test 07/17/2022 with no concerning nodules.  CT chest did show centrilobular emphysema and some mild right subpleural reticulation.  He lives with his wife.  His brother has COPD and is on [place].  No family history of lung cancer.  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  Past Medical History:  Diagnosis Date   Allergy    Anxiety    Hyperlipidemia    Hypertension      Family History  Problem Relation Age of Onset   Diabetes Mother    Arthritis Father    Diabetes Daughter      Social History   Socioeconomic History   Marital status: Married    Spouse name: Not on file   Number of children: Not on file   Years of education: Not on file   Highest education  level: Not on file  Occupational History   Not on file  Tobacco Use   Smoking status: Every Day    Packs/day: 1    Types: Cigarettes   Smokeless tobacco: Never  Vaping Use   Vaping Use: Never used  Substance and Sexual Activity   Alcohol use: Yes   Drug use: Not Currently   Sexual activity: Yes  Other Topics Concern   Not on file  Social History Narrative   Not on file   Social Determinants of Health   Financial Resource Strain: Not on file  Food Insecurity: Not on file  Transportation Needs: Not on file  Physical Activity: Not on file  Stress: Not on file  Social Connections: Not on file  Intimate Partner Violence: Not on file     No Known Allergies   Outpatient Medications Prior to Visit  Medication Sig Dispense Refill   aspirin EC 81 MG tablet Take 81 mg by mouth daily.     atorvastatin (LIPITOR) 80 MG tablet Take 1 tablet (80 mg total) by mouth daily. 90 tablet 1   budesonide-formoterol (SYMBICORT) 160-4.5 MCG/ACT inhaler Inhale 2 puffs into the lungs 2 (two) times daily. 10.2 g 3   clopidogrel (PLAVIX) 75 MG tablet Take 1 tablet (75 mg total) by mouth daily. 90 tablet 4  isosorbide mononitrate (IMDUR) 30 MG 24 hr tablet Take 0.5 tablets (15 mg total) by mouth daily. 45 tablet 1   losartan (COZAAR) 100 MG tablet Take 1 tablet (100 mg total) by mouth daily. 90 tablet 3   Multiple Vitamin (MULTIVITAMIN WITH MINERALS) TABS tablet Take 1 tablet by mouth daily.     nebivolol (BYSTOLIC) 10 MG tablet Take 1 tablet (10 mg total) by mouth daily for 14 days. 14 tablet 0   nitroGLYCERIN (NITROSTAT) 0.4 MG SL tablet Place 1 tablet (0.4 mg total) under the tongue every 5 (five) minutes as needed. 25 tablet 2   primidone (MYSOLINE) 50 MG tablet Take 1 tablet (50 mg total) by mouth 2 (two) times daily. 60 tablet 2   traZODone (DESYREL) 50 MG tablet Take 0.5-1 tablets (25-50 mg total) by mouth at bedtime as needed for sleep. 30 tablet 11   furosemide (LASIX) 20 MG tablet Take 1  tablet (20 mg total) by mouth daily. AS NEEDED ONLY FOR SWELLING (Patient not taking: Reported on 06/11/2022) 90 tablet 3   hydrochlorothiazide (MICROZIDE) 12.5 MG capsule Take 1 capsule (12.5 mg total) by mouth daily. (Patient not taking: Reported on 06/11/2022) 30 capsule 3   fluorouracil (EFUDEX) 5 % cream Apply 1(one) application topically 2(two) times a day for 2-4 weeks (Patient not taking: Reported on 06/11/2022) 40 g 0   LORazepam (ATIVAN) 0.5 MG tablet Take 1 tablet (0.5 mg total) by mouth 2 (two) times daily as needed for anxiety. (Patient not taking: Reported on 06/11/2022) 30 tablet 3   venlafaxine XR (EFFEXOR XR) 37.5 MG 24 hr capsule Take 1 capsule (37.5 mg total) by mouth daily with breakfast. (Patient not taking: Reported on 06/11/2022) 90 capsule 3   No facility-administered medications prior to visit.   Review of Systems  Constitutional:  Negative for chills, fever, malaise/fatigue and weight loss.  HENT:  Negative for congestion, sinus pain and sore throat.   Eyes: Negative.   Respiratory:  Positive for cough and shortness of breath. Negative for hemoptysis, sputum production and wheezing.   Cardiovascular:  Negative for chest pain, palpitations, orthopnea, claudication and leg swelling.  Gastrointestinal:  Negative for abdominal pain, heartburn, nausea and vomiting.  Genitourinary: Negative.   Musculoskeletal:  Negative for joint pain and myalgias.  Skin:  Negative for rash.  Neurological:  Negative for weakness.  Endo/Heme/Allergies:  Positive for environmental allergies.  Psychiatric/Behavioral: Negative.     Objective:   Vitals:   07/24/22 1002  BP: 128/76  Pulse: 62  SpO2: 97%  Weight: 159 lb (72.1 kg)  Height: 5\' 9"  (1.753 m)   Physical Exam Constitutional:      General: He is not in acute distress. HENT:     Head: Normocephalic and atraumatic.  Eyes:     Extraocular Movements: Extraocular movements intact.     Conjunctiva/sclera: Conjunctivae normal.      Pupils: Pupils are equal, round, and reactive to light.  Cardiovascular:     Rate and Rhythm: Normal rate and regular rhythm.     Pulses: Normal pulses.     Heart sounds: Normal heart sounds. No murmur heard. Abdominal:     General: Bowel sounds are normal.     Palpations: Abdomen is soft.  Musculoskeletal:     Right lower leg: No edema.     Left lower leg: No edema.  Lymphadenopathy:     Cervical: No cervical adenopathy.  Skin:    General: Skin is warm and dry.  Neurological:  General: No focal deficit present.     Mental Status: He is alert.  Psychiatric:        Mood and Affect: Mood normal.        Behavior: Behavior normal.        Thought Content: Thought content normal.        Judgment: Judgment normal.     CBC    Component Value Date/Time   WBC 9.9 05/21/2022 1138   RBC 4.00 (L) 05/21/2022 1138   HGB 13.9 05/21/2022 1138   HGB 14.1 10/29/2021 1504   HCT 40.2 05/21/2022 1138   HCT 39.4 10/29/2021 1504   PLT 319.0 05/21/2022 1138   PLT 275 10/29/2021 1504   MCV 100.6 (H) 05/21/2022 1138   MCV 97 10/29/2021 1504   MCH 34.8 (H) 10/29/2021 1504   MCHC 34.5 05/21/2022 1138   RDW 13.2 05/21/2022 1138   RDW 11.8 10/29/2021 1504   LYMPHSABS 1.4 05/21/2022 1138   LYMPHSABS 2.7 10/29/2021 1504   MONOABS 0.6 05/21/2022 1138   EOSABS 0.0 05/21/2022 1138   EOSABS 0.2 10/29/2021 1504   BASOSABS 0.1 05/21/2022 1138   BASOSABS 0.1 10/29/2021 1504      Latest Ref Rng & Units 05/21/2022   11:38 AM 01/29/2022    9:05 AM 10/29/2021    3:04 PM  BMP  Glucose 70 - 99 mg/dL 92  161  096   BUN 6 - 23 mg/dL Creatinine 0.40 - 1.50 mg/dL 0.45  4.09  8.11   BUN/Creat Ratio 10 - 24   14   Sodium 135 - 145 mEq/L 132  132  133   Potassium 3.5 - 5.1 mEq/L 4.7  4.8  4.6   Chloride 96 - 112 mEq/L 99  98  97   CO2 19 - 32 mEq/L Calcium 8.4 - 10.5 mg/dL 9.0  91.4  9.6    Chest imaging: [facility] CT Chest 07/17/22 Cardiovascular: Heart size is normal. There is no  significant pericardial fluid, thickening or pericardial calcification. There is aortic atherosclerosis, as well as atherosclerosis of the great vessels of the mediastinum and the coronary arteries, including calcified atherosclerotic plaque in the left main, left anterior descending and right coronary arteries.   Mediastinum/Nodes: No pathologically enlarged mediastinal or hilar lymph nodes. Please note that accurate exclusion of hilar adenopathy is limited on noncontrast CT scans. Esophagus is unremarkable in appearance. No axillary lymphadenopathy.   Lungs/Pleura: Tiny pulmonary nodule in the periphery of the right upper lobe (axial image 71), with a volume derived mean diameter of only 2.0 mm. No larger more suspicious appearing pulmonary nodules or masses are noted. No acute consolidative airspace disease. No pleural effusions. Mild diffuse bronchial wall thickening with mild centrilobular and paraseptal emphysema.  PFT:     No data to display          Labs:  Path:  Echo: 11/13/21: LV EF 60-65%. RV systolic function and size are normal.   Heart Catheterization 11/01/21:   Prox RCA lesion is 30% stenosed.   Mid RCA-1 lesion is 75% stenosed.   Mid RCA-2 lesion is 40% stenosed.   Prox Cx lesion is 40% stenosed.   Mid LM to Dist LM lesion is 25% stenosed.   A drug-eluting stent was successfully placed.   Post intervention, there is a 0% residual stenosis.   Post intervention, there is a 0% residual stenosis.   The left ventricular systolic  function is normal.   LV end diastolic pressure is normal.      Assessment & Plan:   Centrilobular emphysema - Plan: Pulmonary Function Test, albuterol (VENTOLIN HFA) 108 (90 Base) MCG/ACT inhaler  Leg swelling  Snoring  Exertional dyspnea  Discussion: Eric Gillespie is a 67 year old male, daily smoker with history of hypertension who is referred to pulmonary clinic for shortness of breath.   Patient's shortness of breath is  most likely related to underlying obstructive lung disease as he has evidence of centrilobular emphysema on his CT chest scan.  He is to start Symbicort 160-4.5 mcg 2 puffs twice daily and continue albuterol inhaler as needed.  Recommend smoking cessation using 14 mg nicotine patches daily and mini nicotine lozenges as needed.  Follow-up in 2 months with pulmonary function test.  Melody Comas, MD [facility] Office: [phone]   Current Outpatient Medications:    albuterol (VENTOLIN HFA) 108 (90 Base) MCG/ACT inhaler, Inhale 2 puffs into the lungs every 6 (six) hours as needed for wheezing or shortness of breath., Disp: 18 g, Rfl: 6   aspirin EC 81 MG tablet, Take 81 mg by mouth daily., Disp: , Rfl:    atorvastatin (LIPITOR) 80 MG tablet, Take 1 tablet (80 mg total) by mouth daily., Disp: 90 tablet, Rfl: 1   budesonide-formoterol (SYMBICORT) 160-4.5 MCG/ACT inhaler, Inhale 2 puffs into the lungs 2 (two) times daily., Disp: 10.2 g, Rfl: 3   clopidogrel (PLAVIX) 75 MG tablet, Take 1 tablet (75 mg total) by mouth daily., Disp: 90 tablet, Rfl: 4   isosorbide mononitrate (IMDUR) 30 MG 24 hr tablet, Take 0.5 tablets (15 mg total) by mouth daily., Disp: 45 tablet, Rfl: 1   losartan (COZAAR) 100 MG tablet, Take 1 tablet (100 mg total) by mouth daily., Disp: 90 tablet, Rfl: 3   Multiple Vitamin (MULTIVITAMIN WITH MINERALS) TABS tablet, Take 1 tablet by mouth daily., Disp: , Rfl:    nebivolol (BYSTOLIC) 10 MG tablet, Take 1 tablet (10 mg total) by mouth daily for 14 days., Disp: 14 tablet, Rfl: 0   nitroGLYCERIN (NITROSTAT) 0.4 MG SL tablet, Place 1 tablet (0.4 mg total) under the tongue every 5 (five) minutes as needed., Disp: 25 tablet, Rfl: 2   primidone (MYSOLINE) 50 MG tablet, Take 1 tablet (50 mg total) by mouth 2 (two) times daily., Disp: 60 tablet, Rfl: 2   traZODone (DESYREL) 50 MG tablet, Take 0.5-1 tablets (25-50 mg total) by mouth at bedtime as needed for sleep.,  Disp: 30 tablet, Rfl: 11   furosemide (LASIX) 20 MG tablet, Take 1 tablet (20 mg total) by mouth daily. AS NEEDED ONLY FOR SWELLING (Patient not taking: Reported on 06/11/2022), Disp: 90 tablet, Rfl: 3   hydrochlorothiazide (MICROZIDE) 12.5 MG capsule, Take 1 capsule (12.5 mg total) by mouth daily. (Patient not taking: Reported on 06/11/2022), Disp: 30 capsule, Rfl: 3

## 2022-07-24 NOTE — Patient Instructions (Addendum)
Start symbicort 2 puffs twice daily - rinse mouth out after each use  Use albuterol 1-2 puffs every 4-6 hours as needed for cough or shortness of breath  We will check pulmonary function tests at your follow up visit  Recommend quitting smoking with nicotine replacement therapy. - Use  nicotine patch per day - Use mini nicotine lozenges as needed for break through cravings.  Call 1-800-quit-NOW to get free nicotine replacement and counseling from the state of [place]   Follow up in 2 months

## 2022-07-30 ENCOUNTER — Other Ambulatory Visit (HOSPITAL_BASED_OUTPATIENT_CLINIC_OR_DEPARTMENT_OTHER): Payer: Self-pay

## 2022-08-07 ENCOUNTER — Other Ambulatory Visit: Payer: Self-pay | Admitting: Medical

## 2022-08-07 ENCOUNTER — Other Ambulatory Visit (HOSPITAL_BASED_OUTPATIENT_CLINIC_OR_DEPARTMENT_OTHER): Payer: Self-pay

## 2022-08-07 DIAGNOSIS — I10 Essential (primary) hypertension: Secondary | ICD-10-CM

## 2022-08-07 MED ORDER — NEBIVOLOL HCL 10 MG PO TABS
10.0000 mg | ORAL_TABLET | Freq: Every day | ORAL | 0 refills | Status: DC
Start: 2022-08-07 — End: 2022-08-08
  Filled 2022-08-07: qty 14, 14d supply, fill #0

## 2022-08-08 ENCOUNTER — Other Ambulatory Visit (HOSPITAL_BASED_OUTPATIENT_CLINIC_OR_DEPARTMENT_OTHER): Payer: Self-pay

## 2022-08-08 ENCOUNTER — Telehealth: Payer: Self-pay | Admitting: Cardiology

## 2022-08-08 DIAGNOSIS — I10 Essential (primary) hypertension: Secondary | ICD-10-CM

## 2022-08-08 MED ORDER — NEBIVOLOL HCL 10 MG PO TABS
10.00 mg | ORAL_TABLET | Freq: Every day | ORAL | 0 refills | Status: AC
Start: 2022-08-08 — End: ?
  Filled 2022-08-08: qty 30, 30d supply, fill #0
  Filled 2022-09-19: qty 30, 30d supply, fill #1
  Filled 2022-10-15: qty 30, 30d supply, fill #2

## 2022-08-08 NOTE — Telephone Encounter (Signed)
Pt is calling about 2 things.  He states he needs a 90 day supply of his bystolic sent to his pharmacy, for his PCP only gave his 14 day supply when he last saw him, and advised that further refills come from Cardiology.  90 day supply of the pts bystolic was sent to his confirmed pharmacy of choice.  Pt also wanted to mention to Dr. Shari Prows that his insurance company still has not approved his PET Scan and he is wondering if this is even needed at this time, for he states he's had no cardiac complaints in over 3 months.  He states he's had no chest pain, sob, doe, orthopnea during that time and feels great.  He states his pressures have been running around 120/60.  Pt would like for Dr. Shari Prows to further advise if any further testing is needed at this time being he is asymptomatic, and if so, what test should be in advised in place of the  denied Cardiac PET Scan.  Pt states he has an upcoming follow-up appt with Jari Favre PA-C on 6/4, and can revisit all of this at that time, and if any cardiac issue arise between now and that visit, he will call to let us know.  Pt is aware that I will route this message to Dr. Shari Prows to further review and advise on, and follow-up with him accordingly thereafter.  Pt verbalized understanding and agrees with this plan.

## 2022-08-08 NOTE — Telephone Encounter (Signed)
Eric Gillespie, Eric Gillespie - 08/08/2022  2:00 PM Meriam Sprague, MD  Sent: Thu Aug 08, 2022  4:24 PM  To: Eric Socks, LPN         Message  If no symptoms, we can hold off on stress testing. If symptoms return, we can order a different test at that time.    Pt aware that per Dr. Shari Prows, we can hold off on stress testing at this time, and if symptoms return, just let us know and we can order a different test at that time.   Advised the pt to keep his follow-up appt with Jari Favre PA-C as scheduled in June.   Pt verbalized understanding and agrees with this plan.

## 2022-08-08 NOTE — Telephone Encounter (Signed)
Pt c/o medication issue:  1. Name of Medication: nebivolol (BYSTOLIC) 10 MG tablet   2. How are you currently taking this medication (dosage and times per day)?   Take 1 tablet (10 mg total) by mouth daily for 14 days.    3. Are you having a reaction (difficulty breathing--STAT)? No  4. What is your medication issue? Patient's spouse is calling because they tried to pick up the medication and could only get a 14 day supply. They would like to get a 90 day supply instead.   Patient's spouse also stated that they were supposed to have a stress test scheduled and the insurance denied it. Patient's spouse stated that we were supposed to try again and run it through the insurance again. Please advise.

## 2022-08-08 NOTE — Telephone Encounter (Signed)
Patient of Dr. Shari Prows, routing to correct triage

## 2022-08-12 ENCOUNTER — Other Ambulatory Visit (HOSPITAL_BASED_OUTPATIENT_CLINIC_OR_DEPARTMENT_OTHER): Payer: Self-pay

## 2022-08-13 ENCOUNTER — Other Ambulatory Visit (HOSPITAL_BASED_OUTPATIENT_CLINIC_OR_DEPARTMENT_OTHER): Payer: Self-pay

## 2022-08-15 ENCOUNTER — Other Ambulatory Visit (HOSPITAL_BASED_OUTPATIENT_CLINIC_OR_DEPARTMENT_OTHER): Payer: Self-pay

## 2022-08-19 ENCOUNTER — Other Ambulatory Visit (HOSPITAL_BASED_OUTPATIENT_CLINIC_OR_DEPARTMENT_OTHER): Payer: Self-pay

## 2022-08-20 ENCOUNTER — Other Ambulatory Visit (HOSPITAL_BASED_OUTPATIENT_CLINIC_OR_DEPARTMENT_OTHER): Payer: Self-pay

## 2022-08-21 ENCOUNTER — Other Ambulatory Visit (HOSPITAL_BASED_OUTPATIENT_CLINIC_OR_DEPARTMENT_OTHER): Payer: Self-pay

## 2022-08-27 ENCOUNTER — Other Ambulatory Visit (HOSPITAL_COMMUNITY): Payer: Self-pay

## 2022-08-27 ENCOUNTER — Telehealth: Payer: Self-pay

## 2022-08-27 ENCOUNTER — Other Ambulatory Visit (HOSPITAL_BASED_OUTPATIENT_CLINIC_OR_DEPARTMENT_OTHER): Payer: Self-pay

## 2022-08-27 NOTE — Telephone Encounter (Signed)
Pharmacy Patient Advocate Encounter   Received notification from [facility] MEDICAID that prior authorization for NEBIVOLOL is needed.    PA submitted on 08/27/22 Key [id number] Status is pending  Haze Rushing, CPhT Pharmacy Patient Advocate Specialist Direct Number: [phone] Fax: [phone]

## 2022-08-28 NOTE — Telephone Encounter (Signed)
Pharmacy Patient Advocate Encounter  Received notification from [facility] MEDICAID that the request for prior authorization for NEBIVOLOL has been denied due to PT HAS TO FAIL 2 PREFERRED PER PLANPLEASE ADVISE  Haze Rushing, CPhT Pharmacy Patient Advocate Specialist Direct Number: [phone] Fax: [phone]

## 2022-08-29 NOTE — Telephone Encounter (Signed)
Meriam Sprague, MD  You5 hours ago (10:58 AM)   HP We can change him to coreg 6.25mg  BID if he is okay with it    Called the pt and informed him that his insurance is denying approval of his nebivolol and Dr. Shari Prows suggested that we switch him to coreg, if he's ok with that.   Pt stated that he prefers being on nothing at all and was told he only had to be on nebivolol until June, from date of last stent placement.  Pt states the nebivolol only cost him $13 for a month supply, which isn't a cost issue for him.  Pt states he prefers coming off of nebivolol all together, when he comes in for his follow-up appt with Jari Favre PA-C on 6/4, to discuss this.    Pt states he will continue his nebivolol regimen for now, but would like to hold off on switching to coreg and discuss coming of this regimen all together, when he see's Jari Favre on 6/4.  Informed the pt that I will make Dr. Shari Prows aware of this request and follow-up with accordingly thereafter, if she has further suggestions.   Pt verbalized understanding and agrees with this plan.

## 2022-09-04 ENCOUNTER — Other Ambulatory Visit (HOSPITAL_BASED_OUTPATIENT_CLINIC_OR_DEPARTMENT_OTHER): Payer: Self-pay

## 2022-09-09 NOTE — Progress Notes (Unsigned)
Office Visit    Patient Name: LENN SRINIVASAN Date of Encounter: 09/10/2022  PCP:  Marisue Brooklyn   [facility]  Cardiologist:  Orbie Pyo, MD  Advanced Practice Provider:  No care team member to display Electrophysiologist:  None   HPI    JAREL FEULNER is a 67 y.o. male with a past medical history of hypertension, hyperlipidemia, tobacco abuse, and anxiety presents today for follow-up appointment.  Patient was initially seen in the clinic 10/2021 for several episodes of chest pain with concern for progressive angina given multivessel CAD on CT of the chest.  He ultimately underwent cardiac catheterization on 11/01/2021 which revealed 75% mid RCA disease as well as nonobstructive CAD in the left coronary circulation with 25% distal left main stenosis and 40% proximal circumflex gnosis.  Underwent successful PCI to RCA with excellent angiographic results.  Was discharged on DAPT.  TTE 11/2021 showed LVEF 65%, normal RV, no significant valvular disease.  Was seen in follow-up by Anice Paganini or NP where he had continued to have chest discomfort or palpitations.  Cardiac monitor 8/23 showed normal sinus rhythm, 4 runs of nonsustained SVT with the longest lasting 14.4 seconds, rare SVE and VE.  Was seen in the clinic 02/2022 where he was doing well.  Rare episodes of chest pain.  Continue splint.  March 2024 was seen by Dr. Shari Prows and overall was feeling well that appointment.  Had been having episodes of chest pain that occurred at rest or with exertion.  Had been taking his nitro intermittently.  Each time he takes nitro symptoms resolved within a few minutes.  1 morning, he had an episode of chest pain, took a nitro, then passed out.  Thinks his blood pressure dropped to low and he felt dizzy prior to syncopized in.  Since that time, and not taking the nitro due to fear of passing out.  Has been having some progressive dyspnea on exertion states that he  was having significant SOB even with walking 25 feet.  This did not start occurring until several months after stents.  We discussed ischemic workup at length.  Also, had been having significant tremors and had been seen by neurology and started on primidone which had significantly helped his symptoms.  Blood pressure has been 1 20-1 30s recently.  Previously was in the 150s.  Today, he tells me that he has not had any chest pain.  Does endorse back pain.  This is chronic.  He has had a tough time drinking 64 ounces of water a day.  Mainly drinks Hawaiian punch.  He is concerned about his blood pressure dropping too low.  States he gets lightheaded and dizzy when he bends down and stands back up.  History of smoking since age 71 (roughly 28 years).  He has intermittently tried to quit but has not been successful.  Offered support but patient declined at this time.  He would like to come off some of his medications.  Does not like taking medications.  He is on Plavix and aspirin for a year post stenting and understands that.  Would like to try to stop Bystolic.  Also would like to try a lower dose of Lipitor if possible.  Would recommend waiting until after August appointment to make any changes.  Reports no shortness of breath nor dyspnea on exertion. Reports no chest pain, pressure, or tightness. No edema, orthopnea, PND. Reports no palpitations.   Past Medical History  Past Medical History:  Diagnosis Date   Allergy    Anxiety    Hyperlipidemia    Hypertension    Past Surgical History:  Procedure Laterality Date   CORONARY STENT INTERVENTION N/A 11/01/2021   Procedure: CORONARY STENT INTERVENTION;  Surgeon: Lennette Bihari, MD;  Location: [facility] INVASIVE CV LAB;  Service: Cardiovascular;  Laterality: N/A;   LEFT HEART CATH AND CORONARY ANGIOGRAPHY N/A 11/01/2021   Procedure: LEFT HEART CATH AND CORONARY ANGIOGRAPHY;  Surgeon: Lennette Bihari, MD;  Location: [facility] INVASIVE CV LAB;  Service:  Cardiovascular;  Laterality: N/A;   TONSILLECTOMY      Allergies  No Known Allergies  EKGs/Labs/Other Studies Reviewed:   The following studies were reviewed today: Cardiac Studies & Procedures   CARDIAC CATHETERIZATION  CARDIAC CATHETERIZATION 11/01/2021  Narrative   Prox RCA lesion is 30% stenosed.   Mid RCA-1 lesion is 75% stenosed.   Mid RCA-2 lesion is 40% stenosed.   Prox Cx lesion is 40% stenosed.   Mid LM to Dist LM lesion is 25% stenosed.   A drug-eluting stent was successfully placed.   Post intervention, there is a 0% residual stenosis.   Post intervention, there is a 0% residual stenosis.   The left ventricular systolic function is normal.   LV end diastolic pressure is normal.  Significant CAD involving a large dominant RCA with 30% smooth proximal stenosis, and 75% eccentric stenosis in the RCA followed by 40% narrowing.  Mild nonobstructive CAD in the left coronary circulation with smooth 25% distal left main stenosis and 40% proximal circumflex stenosis.  Preserved global LV function with EF estimated at 55% but with small area of mid inferior hypocontractility.  LVEDP 9 mmHg.  Successful PCI to the mid RCA with insertion of a 3.0 x 20 mm Synergy stent postdilated to 3.5 mm with the stenosis improved to 0%.  It is notable that the patient had significant coronary vasodilation following IC nitroglycerin.  RECOMMENDATION: DAPT for minimum of 6 months but preferably 12 months.  Medical therapy for concomitant CAD.  Smoking cessation is essential.  Aggressive lipid-lowering therapy with target LDL less than 70.  Plan for discharge later today if he remains stable.  Findings Coronary Findings Diagnostic  Dominance: Right  Left Main Mid LM to Dist LM lesion is 25% stenosed.  Left Circumflex Prox Cx lesion is 40% stenosed.  Right Coronary Artery Prox RCA lesion is 30% stenosed. Mid RCA-1 lesion is 75% stenosed. Mid RCA-2 lesion is 40%  stenosed.  Intervention  Mid RCA-1 lesion Stent (Also treats lesions: Mid RCA-2) Pre-stent angioplasty was performed. A drug-eluting stent was successfully placed. Stent strut is well apposed. Post-stent angioplasty was performed. Post-Intervention Lesion Assessment The intervention was successful. Pre-interventional TIMI flow is 3. Post-intervention TIMI flow is 3. No complications occurred at this lesion. There is a 0% residual stenosis post intervention.  Mid RCA-2 lesion Stent (Also treats lesions: Mid RCA-1) See details in Mid RCA-1 lesion. Post-Intervention Lesion Assessment The intervention was successful. Pre-interventional TIMI flow is 3. Post-intervention TIMI flow is 3. No complications occurred at this lesion. There is a 0% residual stenosis post intervention.     ECHOCARDIOGRAM  ECHOCARDIOGRAM COMPLETE 11/13/2021  Narrative ECHOCARDIOGRAM REPORT    Patient Name:   BRIGGS ALCIDE Date of Exam: 11/13/2021 Medical Rec #:  [id number]     Height:       69.0 in Accession #:    [id number]    Weight:  162.0 lb Date of Birth:  05-23-1955    BSA:          1.889 m Patient Age:    65 years      BP:           130/70 mmHg Patient Gender: M             HR:           67 bpm. Exam Location:  [address]  Procedure: 2D Echo, 3D Echo, Cardiac Doppler, Color Doppler and Strain Analysis  Indications:    R07.2 R07.9 Chest pain  History:        Patient has no prior history of Echocardiogram examinations. CAD and Previous Myocardial Infarction, status post LHC 10/2021-Significant CAD involving a large dominant RCA with 30% smooth proximal stenosis and 75% eccentric stenosis in the RCA followed by 40% narrowing. Preserved global LV EF 55% with small area of mid inferior hypocontractility. PCI to the mid RCA, Signs/Symptoms:Chest Pain and 1/6 systolic murmur; Risk Factors:Current Smoker, Dyslipidemia, Hypertension and Family History of Coronary Artery Disease.  Sonographer:     Chanetta Marshall [facility], RDCS Referring Phys: [id number] HEATHER E [place]  IMPRESSIONS   1. Left ventricular ejection fraction, by estimation, is 60 to 65%. Left ventricular ejection fraction by 3D volume is 65 %. The left ventricle has normal function. The left ventricle has no regional wall motion abnormalities. Left ventricular diastolic parameters were normal. The average left ventricular global longitudinal strain is -18.6 %. The global longitudinal strain is normal. 2. Right ventricular systolic function is normal. The right ventricular size is normal. 3. The mitral valve is normal in structure. No evidence of mitral valve regurgitation. No evidence of mitral stenosis. 4. The aortic valve is tricuspid. Aortic valve regurgitation is not visualized. Aortic valve sclerosis/calcification is present, without any evidence of aortic stenosis. 5. The inferior vena cava is normal in size with greater than 50% respiratory variability, suggesting right atrial pressure of 3 mmHg.  FINDINGS Left Ventricle: Left ventricular ejection fraction, by estimation, is 60 to 65%. Left ventricular ejection fraction by 3D volume is 65 %. The left ventricle has normal function. The left ventricle has no regional wall motion abnormalities. The average left ventricular global longitudinal strain is -18.6 %. The global longitudinal strain is normal. The left ventricular internal cavity size was normal in size. There is no left ventricular hypertrophy. Left ventricular diastolic parameters were normal. Normal left ventricular filling pressure.  Right Ventricle: The right ventricular size is normal. No increase in right ventricular wall thickness. Right ventricular systolic function is normal.  Left Atrium: Left atrial size was normal in size.  Right Atrium: Right atrial size was normal in size.  Pericardium: There is no evidence of pericardial effusion.  Mitral Valve: The mitral valve is normal in structure. No evidence  of mitral valve regurgitation. No evidence of mitral valve stenosis.  Tricuspid Valve: The tricuspid valve is normal in structure. Tricuspid valve regurgitation is not demonstrated. No evidence of tricuspid stenosis.  Aortic Valve: The aortic valve is tricuspid. Aortic valve regurgitation is not visualized. Aortic valve sclerosis/calcification is present, without any evidence of aortic stenosis.  Pulmonic Valve: The pulmonic valve was normal in structure. Pulmonic valve regurgitation is not visualized. No evidence of pulmonic stenosis.  Aorta: The aortic root is normal in size and structure.  Venous: The inferior vena cava is normal in size with greater than 50% respiratory variability, suggesting right atrial pressure of 3 mmHg.  IAS/Shunts: The  interatrial septum appears to be lipomatous. No atrial level shunt detected by color flow Doppler.   LEFT VENTRICLE PLAX 2D LVIDd:         4.90 cm         Diastology LVIDs:         2.80 cm         LV e' medial:    7.94 cm/s LV PW:         1.00 cm         LV E/e' medial:  7.4 LV IVS:        0.80 cm         LV e' lateral:   10.40 cm/s LVOT diam:     2.00 cm         LV E/e' lateral: 5.6 LV SV:         63 LV SV Index:   33              2D LVOT Area:     3.14 cm        Longitudinal Strain 2D Strain GLS  -17.5 % (A2C): 2D Strain GLS  -19.9 % (A3C): 2D Strain GLS  -18.4 % (A4C): 2D Strain GLS  -18.6 % Avg:  3D Volume EF LV 3D EF:    Left ventricul ar ejection fraction by 3D volume is 65 %.  3D Volume EF: 3D EF:        65 % LV EDV:       103 ml LV ESV:       36 ml LV SV:        67 ml  RIGHT VENTRICLE             IVC RV Basal diam:  3.50 cm     IVC diam: 1.40 cm RV Mid diam:    3.90 cm RV S prime:     13.20 cm/s TAPSE (M-mode): 2.9 cm  LEFT ATRIUM             Index        RIGHT ATRIUM           Index LA diam:        3.10 cm 1.64 cm/m   RA Area:     10.40 cm LA Vol (A2C):   39.8 ml 21.07 ml/m  RA Volume:   23.20 ml  12.28  ml/m LA Vol (A4C):   31.5 ml 16.68 ml/m LA Biplane Vol: 36.4 ml 19.27 ml/m AORTIC VALVE LVOT Vmax:   103.50 cm/s LVOT Vmean:  63.400 cm/s LVOT VTI:    0.201 m  AORTA Ao Root diam: 3.10 cm Ao Asc diam:  3.30 cm  MITRAL VALVE MV Area (PHT): 3.75 cm    SHUNTS MV Decel Time: 203 msec    Systemic VTI:  0.20 m MV E velocity: 58.65 cm/s  Systemic Diam: 2.00 cm MV A velocity: 67.10 cm/s MV E/A ratio:  0.87  Armanda Magic MD Electronically signed by Armanda Magic MD Signature Date/Time: 11/13/2021/4:14:41 PM    Final    MONITORS  LONG TERM MONITOR (3-14 DAYS) 11/30/2021  Narrative   Patch wear time was 6 days and 22 hours   Predominant rhythm is NSR with average HR 74bpm   4 runs of nonsustained SVT with longest lasting 14.4s   Rare SVE (<1%), rare VE (<1%)   No sustained arrhythmias or significant pauses   Patch Wear Time:  6 days and 22 hours (2023-07-30T15:14:37-0400 to 2023-08-06T13:52:35-0400)  Patient had a min HR of 51 bpm, max HR of 143 bpm, and avg HR of 74 bpm. Predominant underlying rhythm was Sinus Rhythm. 4 Supraventricular Tachycardia runs occurred, the run with the fastest interval lasting 4 beats with a max rate of 143 bpm, the longest lasting 14.4 secs with an avg rate of 93 bpm. Isolated SVEs were rare (<1.0%), SVE Couplets were rare (<1.0%), and SVE Triplets were rare (<1.0%). Isolated VEs were rare (<1.0%), VE Couplets were rare (<1.0%), and no VE Triplets were present. Ventricular Bigeminy was present.  Laurance Flatten, MD            EKG:  EKG is  ordered today.  The ekg ordered today demonstrates normal sinus rhythm, rate 62 bpm  Recent Labs: 05/21/2022: ALT 12; BUN 7; Creatinine, Ser 0.84; Hemoglobin 13.9; Platelets 319.0; Potassium 4.7; Sodium 132; TSH 1.76  Recent Lipid Panel    Component Value Date/Time   CHOL 121 12/12/2021 1048   TRIG 198 (H) 12/12/2021 1048   HDL 43 12/12/2021 1048   CHOLHDL 2.8 12/12/2021 1048   CHOLHDL 3 09/18/2021  1654   VLDL 54.4 (H) 09/18/2021 1654   LDLCALC 46 12/12/2021 1048   LDLDIRECT 60.0 09/18/2021 1654   Home Medications   Current Meds  Medication Sig   albuterol (VENTOLIN HFA) 108 (90 Base) MCG/ACT inhaler Inhale 2 puffs into the lungs every 6 (six) hours as needed for wheezing or shortness of breath.   aspirin EC 81 MG tablet Take 81 mg by mouth daily.   atorvastatin (LIPITOR) 80 MG tablet Take 1 tablet (80 mg total) by mouth daily.   budesonide-formoterol (SYMBICORT) 160-4.5 MCG/ACT inhaler Inhale 2 puffs into the lungs 2 (two) times daily.   clopidogrel (PLAVIX) 75 MG tablet Take 1 tablet (75 mg total) by mouth daily.   furosemide (LASIX) 20 MG tablet Take 1 tablet (20 mg total) by mouth daily. AS NEEDED ONLY FOR SWELLING   losartan (COZAAR) 50 MG tablet Take 1 tablet (50 mg total) by mouth daily.   Multiple Vitamin (MULTIVITAMIN WITH MINERALS) TABS tablet Take 1 tablet by mouth daily.   nebivolol (BYSTOLIC) 10 MG tablet Take 1 tablet (10 mg total) by mouth daily.   nitroGLYCERIN (NITROSTAT) 0.4 MG SL tablet Place 1 tablet (0.4 mg total) under the tongue every 5 (five) minutes as needed.   primidone (MYSOLINE) 50 MG tablet Take 1 tablet (50 mg total) by mouth 2 (two) times daily.   traZODone (DESYREL) 50 MG tablet Take 0.5-1 tablets (25-50 mg total) by mouth at bedtime as needed for sleep.   [DISCONTINUED] losartan (COZAAR) 100 MG tablet Take 1 tablet (100 mg total) by mouth daily.     Review of Systems      All other systems reviewed and are otherwise negative except as noted above.  Physical Exam    VS:  BP 124/74   Pulse 62   Ht 5\' 9"  (1.753 m)   Wt 155 lb 6.4 oz (70.5 kg)   SpO2 96%   BMI 22.95 kg/m  , BMI Body mass index is 22.95 kg/m.  Wt Readings from Last 3 Encounters:  09/10/22 155 lb 6.4 oz (70.5 kg)  07/24/22 159 lb (72.1 kg)  07/03/22 161 lb (73 kg)     GEN: Well nourished, well developed, in no acute distress. HEENT: normal. Neck: Supple, no JVD, carotid  bruits, or masses. Cardiac: RRR, no murmurs, rubs, or gallops. No clubbing, cyanosis, edema.  Radials/PT 2+ and equal bilaterally.  Respiratory:  Respirations regular and unlabored, clear to auscultation bilaterally. GI: Soft, nontender, nondistended. MS: No deformity or atrophy. Skin: Warm and dry, no rash. Neuro:  Strength and sensation are intact. Psych: Normal affect.  Assessment & Plan    Coronary artery disease status post PCI -Continue current medications which include aspirin 81 mg daily, Lipitor 80 mg daily, Plavix 75 mg daily, Lasix 20 mg daily and an extra 20 as needed, losartan 100 mg daily (reduced to 50mg  daily) , Bystolic 10 mg daily -no recent chest pains  Hypertension -Blood pressure well-controlled today -Patient wanted to try lower dose of losartan so we reduced to 50 mg daily -Encouraged him to keep track of his blood pressure daily an hour after morning medicines and send me those values  Tobacco abuse -Encourage cessation -Patient is not interested in nicotine gum or patches at this time -Encouraged to reach out if he gets intentional about quitting  Hyperlipidemia LDL goal less than 70 -Continue Lipitor 80 mg daily -His last LDL was 46, at goal -In the future he would like to try a lower dose of Lipitor but recommended to at least stick with 80 mg until the 1 year mark  Disposition: Follow up 2-3 months with Orbie Pyo, MD or APP.  Signed, Sharlene Dory, PA-C 09/10/2022, 12:45 PM [facility]

## 2022-09-10 ENCOUNTER — Encounter: Payer: Self-pay | Admitting: Physician Assistant

## 2022-09-10 ENCOUNTER — Ambulatory Visit: Payer: Medicaid Other | Attending: Physician Assistant | Admitting: Physician Assistant

## 2022-09-10 ENCOUNTER — Other Ambulatory Visit (HOSPITAL_BASED_OUTPATIENT_CLINIC_OR_DEPARTMENT_OTHER): Payer: Self-pay

## 2022-09-10 VITALS — BP 124/74 | HR 62 | Ht 69.0 in | Wt 155.4 lb

## 2022-09-10 DIAGNOSIS — I10 Essential (primary) hypertension: Secondary | ICD-10-CM | POA: Diagnosis not present

## 2022-09-10 DIAGNOSIS — Z720 Tobacco use: Secondary | ICD-10-CM

## 2022-09-10 DIAGNOSIS — Z79899 Other long term (current) drug therapy: Secondary | ICD-10-CM | POA: Diagnosis not present

## 2022-09-10 DIAGNOSIS — I25118 Atherosclerotic heart disease of native coronary artery with other forms of angina pectoris: Secondary | ICD-10-CM

## 2022-09-10 DIAGNOSIS — R002 Palpitations: Secondary | ICD-10-CM | POA: Diagnosis not present

## 2022-09-10 DIAGNOSIS — Z955 Presence of coronary angioplasty implant and graft: Secondary | ICD-10-CM

## 2022-09-10 DIAGNOSIS — R072 Precordial pain: Secondary | ICD-10-CM

## 2022-09-10 DIAGNOSIS — E785 Hyperlipidemia, unspecified: Secondary | ICD-10-CM | POA: Diagnosis not present

## 2022-09-10 MED ORDER — LOSARTAN POTASSIUM 50 MG PO TABS
50.0000 mg | ORAL_TABLET | Freq: Every day | ORAL | 3 refills | Status: DC
  Filled 2022-09-10: qty 90, 90d supply, fill #0

## 2022-09-10 NOTE — Patient Instructions (Addendum)
Medication Instructions:  1.Decrease losartan to 50 mg daily *If you need a refill on your cardiac medications before your next appointment, please call your pharmacy*  Lab Work: None ordered If you have labs (blood work) drawn today and your tests are completely normal, you will receive your results only by: MyChart Message (if you have MyChart) OR A paper copy in the mail If you have any lab test that is abnormal or we need to change your treatment, we will call you to review the results.  Follow-Up: At [facility], you and your health needs are our priority.  As part of our continuing mission to provide you with exceptional heart care, we have created designated Provider Care Teams.  These Care Teams include your primary Cardiologist (physician) and Advanced Practice Providers (APPs -  Physician Assistants and Nurse Practitioners) who all work together to provide you with the care you need, when you need it.  We recommend signing up for the patient portal called "MyChart".  Sign up information is provided on this After Visit Summary.  MyChart is used to connect with patients for Virtual Visits (Telemedicine).  Patients are able to view lab/test results, encounter notes, upcoming appointments, etc.  Non-urgent messages can be sent to your provider as well.   To learn more about what you can do with MyChart, go to [email or web address].    Your next appointment:   2 month(s)  Provider:   Dr Lynnette Caffey  Other Instructions 1.Check your blood pressure daily, 1 hr after morning medications for 2 weeks, keep a log and send [place] the readings through mychart or call us at the end of the 2 weeks.  2.Increase water intake to 64 oz daily.  Low-Sodium Eating Plan Salt (sodium) helps you keep a healthy balance of fluids in your body. Too much sodium can raise your blood pressure. It can also cause fluid and waste to be held in your body. Your health care provider or dietitian may recommend  a low-sodium eating plan if you have high blood pressure (hypertension), kidney disease, liver disease, or heart failure. Eating less sodium can help lower your blood pressure and reduce swelling. It can also protect your heart, liver, and kidneys. What are tips for following this plan? Reading food labels  Check food labels for the amount of sodium per serving. If you eat more than one serving, you must multiply the listed amount by the number of servings. Choose foods with less than 140 milligrams (mg) of sodium per serving. Avoid foods with 300 mg of sodium or more per serving. Always check how much sodium is in a product, even if the label says "unsalted" or "no salt added." Shopping  Buy products labeled as "low-sodium" or "no salt added." Buy fresh foods. Avoid canned foods and pre-made or frozen meals. Avoid canned, cured, or processed meats. Buy breads that have less than 80 mg of sodium per slice. Cooking  Eat more home-cooked food. Try to eat less restaurant, buffet, and fast food. Try not to add salt when you cook. Use salt-free seasonings or herbs instead of table salt or sea salt. Check with your provider or pharmacist before using salt substitutes. Cook with plant-based oils, such as canola, sunflower, or olive oil. Meal planning When eating at a restaurant, ask if your food can be made with less salt or no salt. Avoid dishes labeled as brined, pickled, cured, or smoked. Avoid dishes made with soy sauce, miso, or teriyaki sauce. Avoid foods that have  monosodium glutamate (MSG) in them. MSG may be added to some restaurant food, sauces, soups, bouillon, and canned foods. Make meals that can be grilled, baked, poached, roasted, or steamed. These are often made with less sodium. General information Try to limit your sodium intake to 1,500-2,300 mg each day, or the amount told by your provider. What foods should I eat? Fruits Fresh, frozen, or canned fruit. Fruit  juice. Vegetables Fresh or frozen vegetables. "No salt added" canned vegetables. "No salt added" tomato sauce and paste. Low-sodium or reduced-sodium tomato and vegetable juice. Grains Low-sodium cereals, such as oats, puffed wheat and rice, and shredded wheat. Low-sodium crackers. Unsalted rice. Unsalted pasta. Low-sodium bread. Whole grain breads and whole grain pasta. Meats and other proteins Fresh or frozen meat, poultry, seafood, and fish. These should have no added salt. Low-sodium canned tuna and salmon. Unsalted nuts. Dried peas, beans, and lentils without added salt. Unsalted canned beans. Eggs. Unsalted nut butters. Dairy Milk. Soy milk. Cheese that is naturally low in sodium, such as ricotta cheese, fresh mozzarella, or Swiss cheese. Low-sodium or reduced-sodium cheese. Cream cheese. Yogurt. Seasonings and condiments Fresh and dried herbs and spices. Salt-free seasonings. Low-sodium mustard and ketchup. Sodium-free salad dressing. Sodium-free light mayonnaise. Fresh or refrigerated horseradish. Lemon juice. Vinegar. Other foods Homemade, reduced-sodium, or low-sodium soups. Unsalted popcorn and pretzels. Low-salt or salt-free chips. The items listed above may not be all the foods and drinks you can have. Talk to a dietitian to learn more. What foods should I avoid? Vegetables Sauerkraut, pickled vegetables, and relishes. Olives. [place] fries. Onion rings. Regular canned vegetables, except low-sodium or reduced-sodium items. Regular canned tomato sauce and paste. Regular tomato and vegetable juice. Frozen vegetables in sauces. Grains Instant hot cereals. Bread stuffing, pancake, and biscuit mixes. Croutons. Seasoned rice or pasta mixes. Noodle soup cups. Boxed or frozen macaroni and cheese. Regular salted crackers. Self-rising flour. Meats and other proteins Meat or fish that is salted, canned, smoked, spiced, or pickled. Precooked or cured meat, such as sausages or meat loaves. Tomasa Blase.  Ham. Pepperoni. Hot dogs. Corned beef. Chipped beef. Salt pork. Jerky. Pickled herring, anchovies, and sardines. Regular canned tuna. Salted nuts. Dairy Processed cheese and cheese spreads. Hard cheeses. Cheese curds. Blue cheese. Feta cheese. String cheese. Regular cottage cheese. Buttermilk. Canned milk. Fats and oils Salted butter. Regular margarine. Ghee. Bacon fat. Seasonings and condiments Onion salt, garlic salt, seasoned salt, table salt, and sea salt. Canned and packaged gravies. Worcestershire sauce. Tartar sauce. Barbecue sauce. Teriyaki sauce. Soy sauce, including reduced-sodium soy sauce. Steak sauce. Fish sauce. Oyster sauce. Cocktail sauce. Horseradish that you find on the shelf. Regular ketchup and mustard. Meat flavorings and tenderizers. Bouillon cubes. Hot sauce. Pre-made or packaged marinades. Pre-made or packaged taco seasonings. Relishes. Regular salad dressings. Salsa. Other foods Salted popcorn and pretzels. Corn chips and puffs. Potato and tortilla chips. Canned or dried soups. Pizza. Frozen entrees and pot pies. The items listed above may not be all the foods and drinks you should avoid. Talk to a dietitian to learn more. This information is not intended to replace advice given to you by your health care provider. Make sure you discuss any questions you have with your health care provider. Document Revised: 04/11/2022 Document Reviewed: 04/11/2022 Elsevier Patient Education  2024 [employer] Inc[employer]  Heart-Healthy Eating Plan Many factors influence your heart health, including eating and exercise habits. Heart health is also called coronary health. Coronary risk increases with abnormal blood fat (lipid) levels. A heart-healthy eating plan  includes limiting unhealthy fats, increasing healthy fats, limiting salt (sodium) intake, and making other diet and lifestyle changes. What is my plan? Your health care provider may recommend that: You limit your fat intake to _________% or  less of your total calories each day. You limit your saturated fat intake to _________% or less of your total calories each day. You limit the amount of cholesterol in your diet to less than _________ mg per day. You limit the amount of sodium in your diet to less than _________ mg per day. What are tips for following this plan? Cooking Cook foods using methods other than frying. Baking, boiling, grilling, and broiling are all good options. Other ways to reduce fat include: Removing the skin from poultry. Removing all visible fats from meats. Steaming vegetables in water or broth. Meal planning  At meals, imagine dividing your plate into fourths: Fill one-half of your plate with vegetables and green salads. Fill one-fourth of your plate with whole grains. Fill one-fourth of your plate with lean protein foods. Eat 2-4 cups of vegetables per day. One cup of vegetables equals 1 cup (91 g) broccoli or cauliflower florets, 2 medium carrots, 1 large bell pepper, 1 large sweet potato, 1 large tomato, 1 medium white potato, 2 cups (150 g) raw leafy greens. Eat 1-2 cups of fruit per day. One cup of fruit equals 1 small apple, 1 large banana, 1 cup (237 g) mixed fruit, 1 large orange,  cup (82 g) dried fruit, 1 cup (240 mL) 100% fruit juice. Eat more foods that contain soluble fiber. Examples include apples, broccoli, carrots, beans, peas, and barley. Aim to get 25-30 g of fiber per day. Increase your consumption of legumes, nuts, and seeds to 4-5 servings per week. One serving of dried beans or legumes equals  cup (90 g) cooked, 1 serving of nuts is  oz (12 almonds, 24 pistachios, or 7 walnut halves), and 1 serving of seeds equals  oz (8 g). Fats Choose healthy fats more often. Choose monounsaturated and polyunsaturated fats, such as olive and canola oils, avocado oil, flaxseeds, walnuts, almonds, and seeds. Eat more omega-3 fats. Choose salmon, mackerel, sardines, tuna, flaxseed oil, and ground  flaxseeds. Aim to eat fish at least 2 times each week. Check food labels carefully to identify foods with trans fats or high amounts of saturated fat. Limit saturated fats. These are found in animal products, such as meats, butter, and cream. Plant sources of saturated fats include palm oil, palm kernel oil, and coconut oil. Avoid foods with partially hydrogenated oils in them. These contain trans fats. Examples are stick margarine, some tub margarines, cookies, crackers, and other baked goods. Avoid fried foods. General information Eat more home-cooked food and less restaurant, buffet, and fast food. Limit or avoid alcohol. Limit foods that are high in added sugar and simple starches such as foods made using white refined flour (white breads, pastries, sweets). Lose weight if you are overweight. Losing just 5-10% of your body weight can help your overall health and prevent diseases such as diabetes and heart disease. Monitor your sodium intake, especially if you have high blood pressure. Talk with your health care provider about your sodium intake. Try to incorporate more vegetarian meals weekly. What foods should I eat? Fruits All fresh, canned (in natural juice), or frozen fruits. Vegetables Fresh or frozen vegetables (raw, steamed, roasted, or grilled). Green salads. Grains Most grains. Choose whole wheat and whole grains most of the time. Rice and pasta, including  brown rice and pastas made with whole wheat. Meats and other proteins Lean, well-trimmed beef, veal, pork, and lamb. Chicken and [place] without skin. All fish and shellfish. Wild duck, rabbit, pheasant, and venison. Egg whites or low-cholesterol egg substitutes. Dried beans, peas, lentils, and tofu. Seeds and most nuts. Dairy Low-fat or nonfat cheeses, including ricotta and mozzarella. Skim or 1% milk (liquid, powdered, or evaporated). Buttermilk made with low-fat milk. Nonfat or low-fat yogurt. Fats and oils Non-hydrogenated  (trans-free) margarines. Vegetable oils, including soybean, sesame, sunflower, olive, avocado, peanut, safflower, corn, canola, and cottonseed. Salad dressings or mayonnaise made with a vegetable oil. Beverages Water (mineral or sparkling). Coffee and tea. Unsweetened ice tea. Diet beverages. Sweets and desserts Sherbet, gelatin, and fruit ice. Small amounts of dark chocolate. Limit all sweets and desserts. Seasonings and condiments All seasonings and condiments. The items listed above may not be a complete list of foods and beverages you can eat. Contact a dietitian for more options. What foods should I avoid? Fruits Canned fruit in heavy syrup. Fruit in cream or butter sauce. Fried fruit. Limit coconut. Vegetables Vegetables cooked in cheese, cream, or butter sauce. Fried vegetables. Grains Breads made with saturated or trans fats, oils, or whole milk. Croissants. Sweet rolls. Donuts. High-fat crackers, such as cheese crackers and chips. Meats and other proteins Fatty meats, such as hot dogs, ribs, sausage, bacon, rib-eye roast or steak. High-fat deli meats, such as salami and bologna. Caviar. Domestic duck and goose. Organ meats, such as liver. Dairy Cream, sour cream, cream cheese, and creamed cottage cheese. Whole-milk cheeses. Whole or 2% milk (liquid, evaporated, or condensed). Whole buttermilk. Cream sauce or high-fat cheese sauce. Whole-milk yogurt. Fats and oils Meat fat, or shortening. Cocoa butter, hydrogenated oils, palm oil, coconut oil, palm kernel oil. Solid fats and shortenings, including bacon fat, salt pork, lard, and butter. Nondairy cream substitutes. Salad dressings with cheese or sour cream. Beverages Regular sodas and any drinks with added sugar. Sweets and desserts Frosting. Pudding. Cookies. Cakes. Pies. Milk chocolate or white chocolate. Buttered syrups. Full-fat ice cream or ice cream drinks. The items listed above may not be a complete list of foods and  beverages to avoid. Contact a dietitian for more information. Summary Heart-healthy meal planning includes limiting unhealthy fats, increasing healthy fats, limiting salt (sodium) intake and making other diet and lifestyle changes. Lose weight if you are overweight. Losing just 5-10% of your body weight can help your overall health and prevent diseases such as diabetes and heart disease. Focus on eating a balance of foods, including fruits and vegetables, low-fat or nonfat dairy, lean protein, nuts and legumes, whole grains, and heart-healthy oils and fats. This information is not intended to replace advice given to you by your health care provider. Make sure you discuss any questions you have with your health care provider. Document Revised: 04/30/2021 Document Reviewed: 04/30/2021 Elsevier Patient Education  2024 [employer].

## 2022-09-19 ENCOUNTER — Other Ambulatory Visit (HOSPITAL_BASED_OUTPATIENT_CLINIC_OR_DEPARTMENT_OTHER): Payer: Self-pay

## 2022-10-02 ENCOUNTER — Ambulatory Visit: Payer: Medicaid Other | Admitting: Medical

## 2022-10-02 ENCOUNTER — Other Ambulatory Visit (HOSPITAL_BASED_OUTPATIENT_CLINIC_OR_DEPARTMENT_OTHER): Payer: Self-pay

## 2022-10-03 ENCOUNTER — Other Ambulatory Visit (HOSPITAL_BASED_OUTPATIENT_CLINIC_OR_DEPARTMENT_OTHER): Payer: Self-pay

## 2022-10-08 ENCOUNTER — Ambulatory Visit: Payer: Medicaid Other | Admitting: Medical

## 2022-10-11 ENCOUNTER — Other Ambulatory Visit (HOSPITAL_BASED_OUTPATIENT_CLINIC_OR_DEPARTMENT_OTHER): Payer: Self-pay

## 2022-10-11 DIAGNOSIS — R251 Tremor, unspecified: Secondary | ICD-10-CM | POA: Diagnosis not present

## 2022-10-11 MED ORDER — PRIMIDONE 50 MG PO TABS
50.0000 mg | ORAL_TABLET | Freq: Two times a day (BID) | ORAL | 2 refills | Status: DC
  Filled 2022-10-11: qty 60, 30d supply, fill #0

## 2022-10-15 ENCOUNTER — Other Ambulatory Visit (HOSPITAL_BASED_OUTPATIENT_CLINIC_OR_DEPARTMENT_OTHER): Payer: Self-pay

## 2022-10-15 ENCOUNTER — Other Ambulatory Visit: Payer: Self-pay

## 2022-10-16 ENCOUNTER — Ambulatory Visit (INDEPENDENT_AMBULATORY_CARE_PROVIDER_SITE_OTHER): Payer: Medicaid Other | Admitting: Medical

## 2022-10-16 ENCOUNTER — Ambulatory Visit (HOSPITAL_BASED_OUTPATIENT_CLINIC_OR_DEPARTMENT_OTHER)
Admission: RE | Admit: 2022-10-16 | Discharge: 2022-10-16 | Disposition: A | Discharge status: Home / Self Care | Payer: Medicaid Other | Source: Ambulatory Visit | Attending: Medical | Admitting: Medical

## 2022-10-16 ENCOUNTER — Other Ambulatory Visit (HOSPITAL_BASED_OUTPATIENT_CLINIC_OR_DEPARTMENT_OTHER): Payer: Self-pay

## 2022-10-16 ENCOUNTER — Encounter: Payer: Self-pay | Admitting: Medical

## 2022-10-16 VITALS — BP 132/68 | HR 81 | Ht 69.0 in | Wt 156.0 lb

## 2022-10-16 DIAGNOSIS — I10 Essential (primary) hypertension: Secondary | ICD-10-CM | POA: Diagnosis not present

## 2022-10-16 DIAGNOSIS — I2510 Atherosclerotic heart disease of native coronary artery without angina pectoris: Secondary | ICD-10-CM

## 2022-10-16 DIAGNOSIS — R251 Tremor, unspecified: Secondary | ICD-10-CM

## 2022-10-16 DIAGNOSIS — I700 Atherosclerosis of aorta: Secondary | ICD-10-CM | POA: Diagnosis not present

## 2022-10-16 DIAGNOSIS — J449 Chronic obstructive pulmonary disease, unspecified: Secondary | ICD-10-CM | POA: Diagnosis not present

## 2022-10-16 DIAGNOSIS — I2584 Coronary atherosclerosis due to calcified coronary lesion: Secondary | ICD-10-CM | POA: Diagnosis not present

## 2022-10-16 DIAGNOSIS — R0600 Dyspnea, unspecified: Secondary | ICD-10-CM | POA: Insufficient documentation

## 2022-10-16 LAB — COMPREHENSIVE METABOLIC PANEL
ALT: 23 U/L (ref 0–53)
AST: 49 U/L — ABNORMAL HIGH (ref 0–37)
Albumin: 4.3 g/dL (ref 3.5–5.2)
Alkaline Phosphatase: 128 U/L — ABNORMAL HIGH (ref 39–117)
BUN: 12 mg/dL (ref 6–23)
CO2: 25 mEq/L (ref 19–32)
Calcium: 9.8 mg/dL (ref 8.4–10.5)
Chloride: 97 mEq/L (ref 96–112)
Creatinine, Ser: 0.94 mg/dL (ref 0.40–1.50)
GFR: 85.64 mL/min (ref >60.00)
Glucose, Bld: 125 mg/dL — ABNORMAL HIGH (ref 70–99)
Potassium: 5 mEq/L (ref 3.5–5.1)
Sodium: 130 mEq/L — ABNORMAL LOW (ref 135–145)
Total Bilirubin: 0.5 mg/dL (ref 0.2–1.2)
Total Protein: 7.1 g/dL (ref 6.0–8.3)

## 2022-10-16 LAB — BRAIN NATRIURETIC PEPTIDE: Pro B Natriuretic peptide (BNP): 22 pg/mL (ref 0.0–100.0)

## 2022-10-16 MED ORDER — METHYLPREDNISOLONE 4 MG PO TBPK
ORAL_TABLET | ORAL | 0 refills | Status: AC
  Filled 2022-10-16: qty 21, 6d supply, fill #0

## 2022-10-16 MED ORDER — NEBIVOLOL HCL 10 MG PO TABS
10.0000 mg | ORAL_TABLET | Freq: Every day | ORAL | 0 refills | Status: DC
Start: 2022-10-16 — End: 2022-11-06
  Filled 2022-10-16: qty 90, 90d supply, fill #0

## 2022-10-16 MED ORDER — ATORVASTATIN CALCIUM 80 MG PO TABS
80.0000 mg | ORAL_TABLET | Freq: Every day | ORAL | 1 refills | Status: DC
  Filled 2022-10-16: qty 90, 90d supply, fill #0

## 2022-10-16 NOTE — Progress Notes (Signed)
Subjective:    Patient ID: Eric Gillespie, male    DOB: 13-Nov-1955, 67 y.o.   MRN: [id number]  HPI  Pt in for follow up.   In past referred for tremors.  Below is from neurologist visit note review.  "Impression  67 year old male presents to clinic for follow-up visit. Exam remains unchanged from previous exam. Doing well on primidone 50 mg twice daily. Will continue this dose at this current regimen at this time."   Pt states he has been progessively more short of breath.   I had referred pt to pulmonolgist back in March.   "Centrilobular emphysema - Plan: Pulmonary Function Test, albuterol (VENTOLIN HFA) 108 (90 Base) MCG/ACT inhaler   Leg swelling   Snoring   Exertional dyspnea   Discussion: Rhea Thrun is a 67 year old male, daily smoker with history of hypertension who is referred to pulmonary clinic for shortness of breath.    Patient's shortness of breath is most likely related to underlying obstructive lung disease as he has evidence of centrilobular emphysema on his CT chest scan.   He is to start Symbicort 160-4.5 mcg 2 puffs twice daily and continue albuterol inhaler as needed.   Recommend smoking cessation using 14 mg nicotine patches daily and mini nicotine lozenges as needed."  Pt explains to me that he is getting very short of breath with minimal activity. 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 Not reporting cough. No pedal edema.   On review pt is not diabetic.     Review of Systems  Constitutional:  Negative for chills, fatigue and fever.  Respiratory:  Positive for shortness of breath. Negative for chest tightness and wheezing.   Cardiovascular:  Negative for chest pain and palpitations.  Gastrointestinal:  Negative for abdominal pain, constipation and nausea.  Genitourinary:  Negative for dysuria and frequency.  Musculoskeletal:  Negative for back pain, myalgias and neck pain.  Skin:  Negative for rash.  Neurological:  Negative  for dizziness, speech difficulty and light-headedness.  Hematological:  Negative for adenopathy. Does not bruise/bleed easily.  Psychiatric/Behavioral:  Negative for behavioral problems, confusion and dysphoric mood.     Past Medical History:  Diagnosis Date   Allergy    Anxiety    Hyperlipidemia    Hypertension      Social History   Socioeconomic History   Marital status: Married    Spouse name: Not on file   Number of children: Not on file   Years of education: Not on file   Highest education level: Not on file  Occupational History   Not on file  Tobacco Use   Smoking status: Every Day    Packs/day: 1    Types: Cigarettes   Smokeless tobacco: Never  Vaping Use   Vaping Use: Never used  Substance and Sexual Activity   Alcohol use: Yes   Drug use: Not Currently   Sexual activity: Yes  Other Topics Concern   Not on file  Social History Narrative   Not on file   Social Determinants of Health   Financial Resource Strain: Not on file  Food Insecurity: Not on file  Transportation Needs: Not on file  Physical Activity: Not on file  Stress: Not on file  Social Connections: Not on file  Intimate Partner Violence: Not on file    Past Surgical History:  Procedure Laterality Date   CORONARY STENT INTERVENTION N/A 11/01/2021   Procedure: CORONARY STENT INTERVENTION;  Surgeon: Lennette Bihari,  MD;  Location: [facility];  Service: Cardiovascular;  Laterality: N/A;   LEFT HEART CATH AND CORONARY ANGIOGRAPHY N/A 11/01/2021   Procedure: LEFT HEART CATH AND CORONARY ANGIOGRAPHY;  Surgeon: Lennette Bihari, MD;  Location: [facility];  Service: Cardiovascular;  Laterality: N/A;   TONSILLECTOMY      Family History  Problem Relation Age of Onset   Diabetes Mother    Arthritis Father    Diabetes Daughter     No Known Allergies  Current Outpatient Medications on File Prior to Visit  Medication Sig Dispense Refill   albuterol (VENTOLIN HFA) 108 (90 Base)  MCG/ACT inhaler Inhale 2 puffs into the lungs every 6 (six) hours as needed for wheezing or shortness of breath. 18 g 6   aspirin EC 81 MG tablet Take 81 mg by mouth daily.     atorvastatin (LIPITOR) 80 MG tablet Take 1 tablet (80 mg total) by mouth daily. 90 tablet 1   budesonide-formoterol (SYMBICORT) 160-4.5 MCG/ACT inhaler Inhale 2 puffs into the lungs 2 (two) times daily. 10.2 g 3   clopidogrel (PLAVIX) 75 MG tablet Take 1 tablet (75 mg total) by mouth daily. 90 tablet 4   furosemide (LASIX) 20 MG tablet Take 1 tablet (20 mg total) by mouth daily. AS NEEDED ONLY FOR SWELLING 90 tablet 3   losartan (COZAAR) 50 MG tablet Take 1 tablet (50 mg total) by mouth daily. 90 tablet 3   Multiple Vitamin (MULTIVITAMIN WITH MINERALS) TABS tablet Take 1 tablet by mouth daily.     nitroGLYCERIN (NITROSTAT) 0.4 MG SL tablet Place 1 tablet (0.4 mg total) under the tongue every 5 (five) minutes as needed. 25 tablet 2   primidone (MYSOLINE) 50 MG tablet Take 1 tablet (50 mg total) by mouth 2 (two) times daily. 60 tablet 2   traZODone (DESYREL) 50 MG tablet Take 0.5-1 tablets (25-50 mg total) by mouth at bedtime as needed for sleep. 30 tablet 11   isosorbide mononitrate (IMDUR) 30 MG 24 hr tablet Take 0.5 tablets (15 mg total) by mouth daily. (Patient not taking: Reported on 09/10/2022) 45 tablet 1   No current facility-administered medications on file prior to visit.    BP 132/68   Pulse 81   Ht 5\' 9"  (1.753 m)   Wt 156 lb (70.8 kg)   SpO2 96%   BMI 23.04 kg/m        Objective:   Physical Exam  General Mental Status- Alert. General Appearance- Not in acute distress.   Skin General: Color- Normal Color. Moisture- Normal Moisture.  Neck Carotid Arteries- Normal color. Moisture- Normal Moisture. No carotid bruits. No JVD.  Chest and Lung Exam Auscultation: Breath Sounds: even and unlabored. Shallow respirations bilaterally.  Cardiovascular Auscultation:Rythm- Regular. Murmurs & Other Heart  Sounds:Auscultation of the heart reveals- No Murmurs.  Abdomen Inspection:-Inspeection Normal. Palpation/Percussion:Note:No mass. Palpation and Percussion of the abdomen reveal- Non Tender, Non Distended + BS, no rebound or guarding.   Neurologic Cranial Nerve exam:- CN III-XII intact(No nystagmus), symmetric smile. Drift Test:- No drift. Romberg Exam:- Negative.  Heal to Toe Gait exam:-Normal. Finger to Nose:- Normal/Intact Strength:- 5/5 equal and symmetric strength both upper and lower extremities.    Lower ext- no obvious pedal edema. Calfs symmetric. Negative homans signs.       Assessment & Plan:   Patient Instructions  1. Hypertension, unspecified type Bp well controlled on recheck. Continue current bp med regimen. - Comp Met (CMET)  2. Chronic obstructive pulmonary  disease, unspecified COPD type (HCC) Continue symbicort. - DG Chest 2 View; Future -Want to review cxr and labs. Rx low dose medrol and continue symbicort.  -Also after review placed referral back to pulmonologist.  3. Dyspnea, unspecified type  - DG Chest 2 View; Future - B Nat Peptide - Comp Met (CMET)  4. Tremor  Improved. Continue to follow up with neurologist. Continue primidone.  Placed referral to new cardiologist since your current cardiologist is moving.  Follow up date to be determined after lab and imaging review.   Esperanza Richters, PA-C

## 2022-10-16 NOTE — Patient Instructions (Addendum)
1. Hypertension, unspecified type Bp well controlled on recheck. Continue current bp med regimen. - Comp Met (CMET)  2. Chronic obstructive pulmonary disease, unspecified COPD type (HCC) Continue symbicort. - DG Chest 2 View; Future -Want to review cxr and labs. Rx low dose medrol and continue symbicort.  -Also after review placed referral back to pulmonologist.  3. Dyspnea, unspecified type  - DG Chest 2 View; Future - B Nat Peptide - Comp Met (CMET)  4. Tremor  Improved. Continue to follow up with neurologist. Continue primidone.  Placed referral to new cardiologist since your current cardiologist is moving.  Follow up date to be determined after lab and imaging review.

## 2022-10-16 NOTE — Addendum Note (Signed)
Addended by: Gwenevere Abbot on: 10/16/2022 08:59 PM   Modules accepted: Orders

## 2022-10-17 ENCOUNTER — Other Ambulatory Visit (HOSPITAL_BASED_OUTPATIENT_CLINIC_OR_DEPARTMENT_OTHER): Payer: Self-pay

## 2022-11-06 ENCOUNTER — Telehealth: Payer: Self-pay

## 2022-11-06 ENCOUNTER — Telehealth: Payer: Self-pay | Admitting: Medical

## 2022-11-06 NOTE — Telephone Encounter (Signed)
Pt's wife was in the office and wanted pcp to be aware pt passed 7.21.

## 2022-11-06 NOTE — Telephone Encounter (Signed)
Caller Name: Wife Rollin Mesidor Phone #:   [phone]  Date of death: 2022/11/26 Place of death: N/A  Time of death: N/A             Autopsy performed:  Funeral Home:   Case #

## 2022-11-06 NOTE — Telephone Encounter (Signed)
Opened in error

## 2022-11-07 DEATH — deceased

## 2022-12-18 ENCOUNTER — Ambulatory Visit: Payer: Medicaid Other | Admitting: Dermatology

## 2022-12-25 ENCOUNTER — Ambulatory Visit: Payer: Medicaid Other | Admitting: Cardiology

## 2022-12-30 ENCOUNTER — Ambulatory Visit: Payer: Medicaid Other | Admitting: Pulmonary Disease

## 2023-11-20 IMAGING — CT CT CHEST LUNG CANCER SCREENING LOW DOSE W/O CM
1 series · 10 of 10 positions shown, 13 images · non-contrast
Comparison: No priors.

CLINICAL DATA: 65-year-old male current smoker with 51 pack-year
history of smoking. Lung cancer screening examination.



[ct lung segmentation data · axial · 0.74mm/px · z∈[-555,-555]mm · 10 of 345 frames shown]
[frame 1/345  mediastinal]
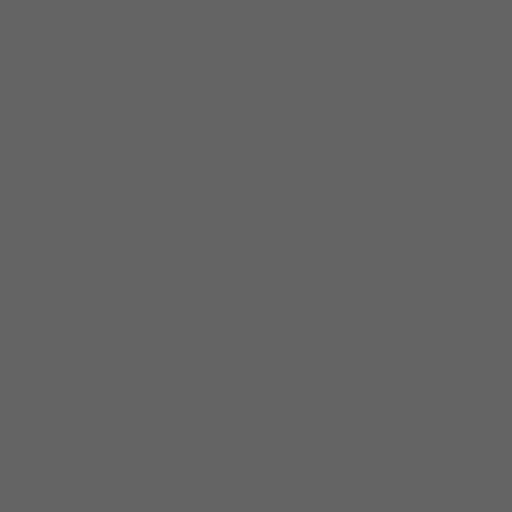
[frame 1/345  lung]
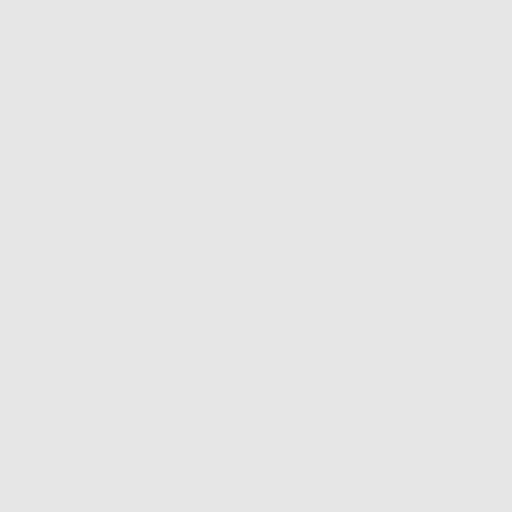
[frame 39/345  lung]
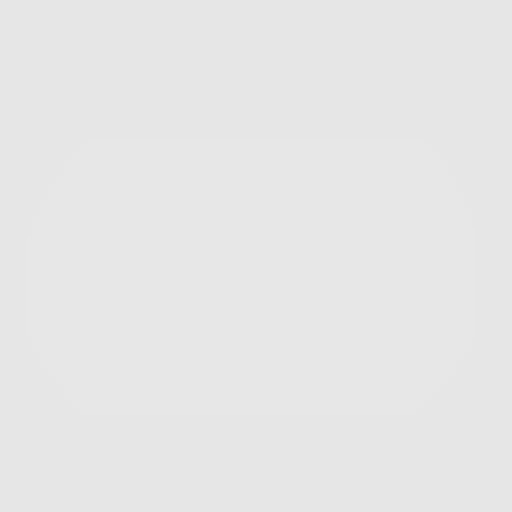
[frame 77/345  lung]
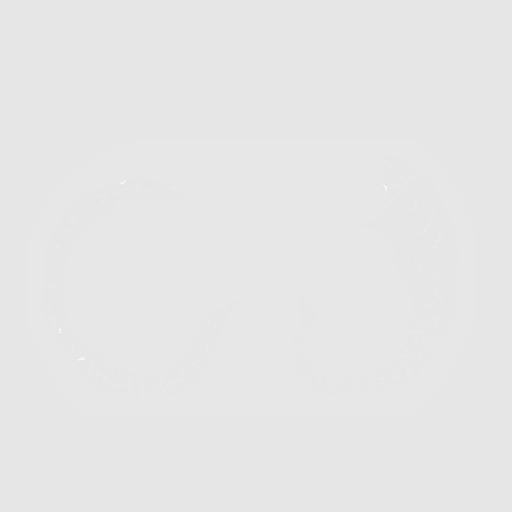
[frame 115/345  lung]
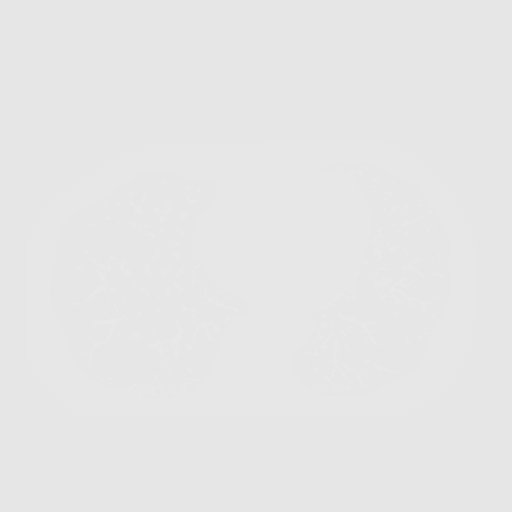
[frame 153/345  mediastinal]
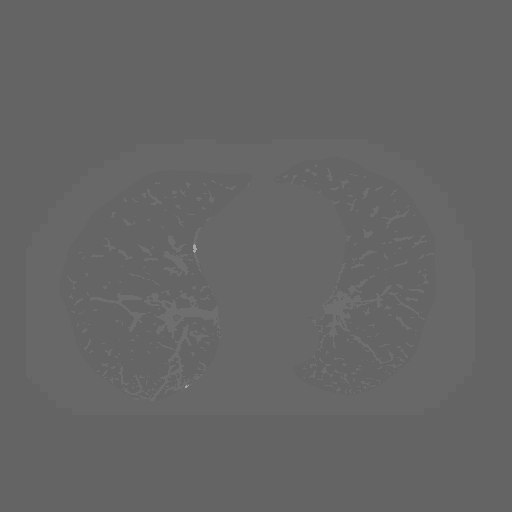
[frame 153/345  lung]
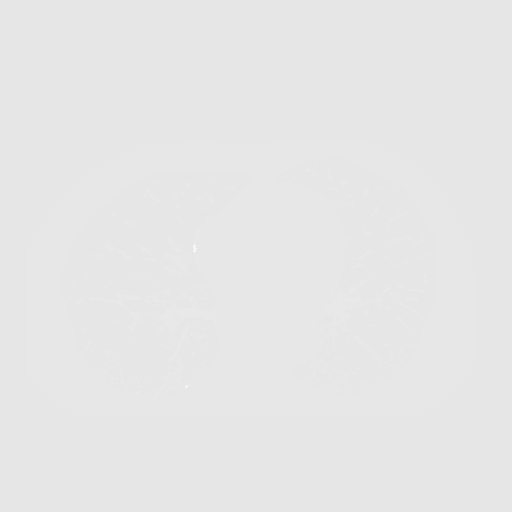
[frame 192/345  lung]
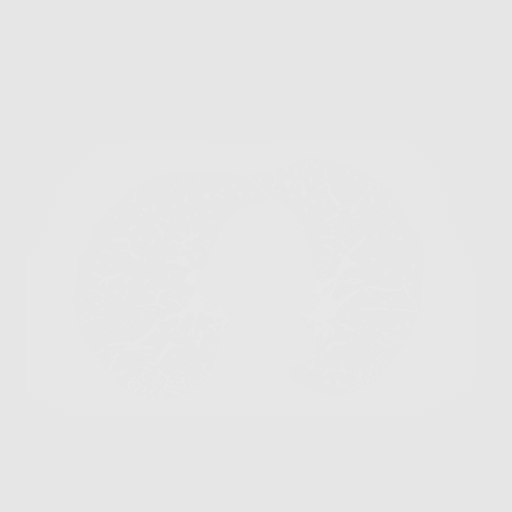
[frame 230/345  lung]
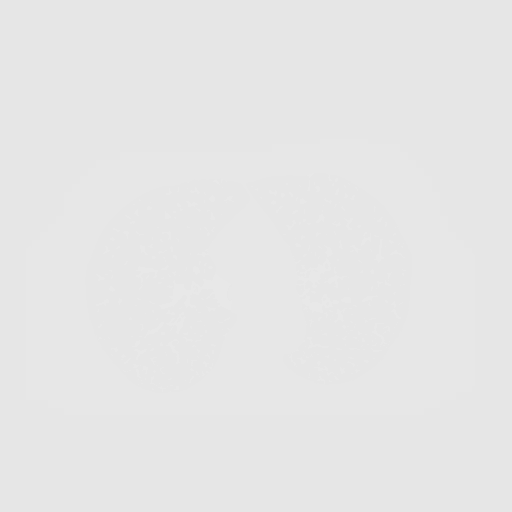
[frame 268/345  lung]
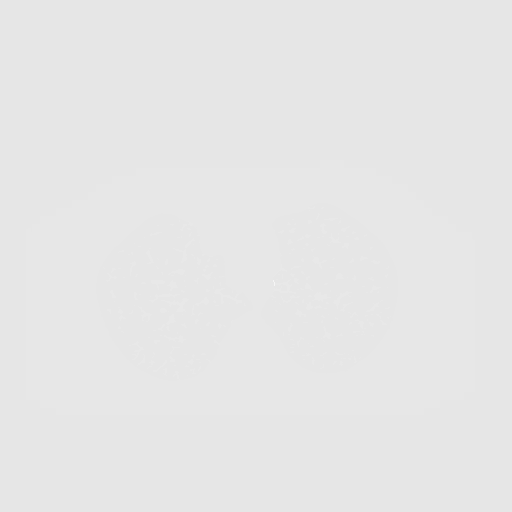
[frame 306/345  mediastinal]
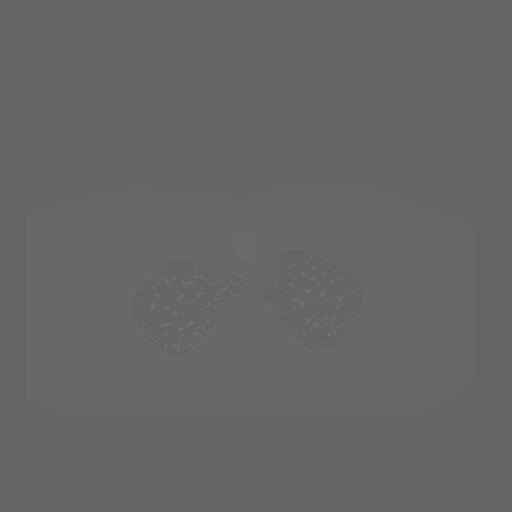
[frame 306/345  lung]
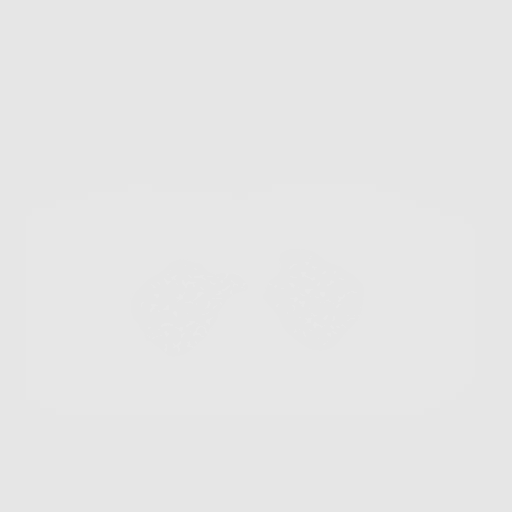
[frame 345/345  lung]
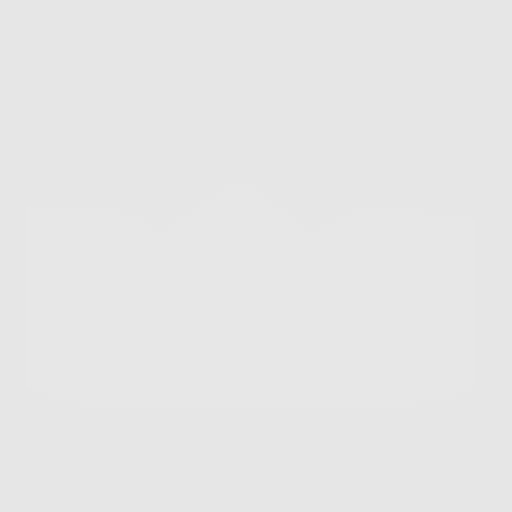

[10 of 10 positions shown; findings below may reference images not displayed]

FINDINGS: Cardiovascular: Heart size is normal. There is no significant
pericardial fluid, thickening or pericardial calcification. There is
aortic atherosclerosis, as well as atherosclerosis of the great
vessels of the mediastinum and the coronary arteries, including
calcified atherosclerotic plaque in the left main, left anterior
descending and right coronary arteries.

Mediastinum/Nodes: No pathologically enlarged mediastinal or hilar
lymph nodes. Please note that accurate exclusion of hilar adenopathy
is limited on noncontrast CT scans. Esophagus is unremarkable in
appearance. No axillary lymphadenopathy.

Lungs/Pleura: No suspicious appearing pulmonary nodules or masses
are noted. No acute consolidative airspace disease. No pleural
effusions. Diffuse bronchial wall thickening with mild centrilobular
and paraseptal emphysema

Upper Abdomen: Aortic atherosclerosis.

Musculoskeletal: Bilateral gynecomastia. There are no aggressive
appearing lytic or blastic lesions noted in the visualized portions
of the skeleton.
IMPRESSION: 1. Lung-RADS 1S, negative. Continue annual screening with low-dose
chest CT without contrast in 12 months.
2. The "S" modifier above refers to potentially clinically
significant non lung cancer related findings. Specifically, there is
aortic atherosclerosis, in addition to left main and 2 vessel
coronary artery disease. Please note that although the presence of
coronary artery calcium documents the presence of coronary artery
disease, the severity of this disease and any potential stenosis
cannot be assessed on this non-gated CT examination. Assessment for
potential risk factor modification, dietary therapy or pharmacologic
therapy may be warranted, if clinically indicated.
3. Mild diffuse bronchial wall thickening with mild centrilobular
and paraseptal emphysema; imaging findings suggestive of underlying
COPD.

Aortic Atherosclerosis (ADMEM-MUA.A) and Emphysema (ADMEM-591.E).
